# Patient Record
Sex: Female | Born: 1950 | Race: White | Hispanic: No | Marital: Married | Smoking: Never smoker
Health system: Southern US, Community
[De-identification: ages and names within clinical notes are randomized; demographics above are authoritative.]

## PROBLEM LIST (undated history)

## (undated) DIAGNOSIS — N6019 Diffuse cystic mastopathy of unspecified breast: Secondary | ICD-10-CM

## (undated) DIAGNOSIS — T7840XA Allergy, unspecified, initial encounter: Secondary | ICD-10-CM

## (undated) DIAGNOSIS — M8580 Other specified disorders of bone density and structure, unspecified site: Secondary | ICD-10-CM

## (undated) DIAGNOSIS — I10 Essential (primary) hypertension: Secondary | ICD-10-CM

## (undated) DIAGNOSIS — D219 Benign neoplasm of connective and other soft tissue, unspecified: Secondary | ICD-10-CM

## (undated) HISTORY — DX: Diffuse cystic mastopathy of unspecified breast: N60.19

## (undated) HISTORY — DX: Benign neoplasm of connective and other soft tissue, unspecified: D21.9

## (undated) HISTORY — PX: TUBAL LIGATION: SHX77

## (undated) HISTORY — DX: Essential (primary) hypertension: I10

## (undated) HISTORY — DX: Allergy, unspecified, initial encounter: T78.40XA

## (undated) HISTORY — PX: TONSILLECTOMY AND ADENOIDECTOMY: SHX28

## (undated) HISTORY — DX: Other specified disorders of bone density and structure, unspecified site: M85.80

## (undated) HISTORY — PX: BREAST EXCISIONAL BIOPSY: SUR124

## (undated) HISTORY — PX: BREAST SURGERY: SHX581

---

## 1977-07-26 HISTORY — PX: BREAST EXCISIONAL BIOPSY: SUR124

## 1979-07-27 HISTORY — PX: OTHER SURGICAL HISTORY: SHX169

## 1997-07-26 HISTORY — PX: ABDOMINAL HYSTERECTOMY: SHX81

## 1998-09-29 ENCOUNTER — Other Ambulatory Visit: Admission: RE | Admit: 1998-09-29 | Discharge: 1998-09-29 | Payer: Self-pay | Admitting: Obstetrics and Gynecology

## 1999-08-14 ENCOUNTER — Other Ambulatory Visit: Admission: RE | Admit: 1999-08-14 | Discharge: 1999-08-14 | Payer: Self-pay | Admitting: Obstetrics and Gynecology

## 1999-11-26 ENCOUNTER — Other Ambulatory Visit: Admission: RE | Admit: 1999-11-26 | Discharge: 1999-11-26 | Payer: Self-pay | Admitting: Obstetrics and Gynecology

## 1999-11-26 ENCOUNTER — Encounter (INDEPENDENT_AMBULATORY_CARE_PROVIDER_SITE_OTHER): Payer: Self-pay

## 2000-08-26 ENCOUNTER — Other Ambulatory Visit: Admission: RE | Admit: 2000-08-26 | Discharge: 2000-08-26 | Payer: Self-pay | Admitting: Obstetrics and Gynecology

## 2001-09-13 ENCOUNTER — Other Ambulatory Visit: Admission: RE | Admit: 2001-09-13 | Discharge: 2001-09-13 | Payer: Self-pay | Admitting: Obstetrics and Gynecology

## 2002-09-19 ENCOUNTER — Other Ambulatory Visit: Admission: RE | Admit: 2002-09-19 | Discharge: 2002-09-19 | Payer: Self-pay | Admitting: Obstetrics and Gynecology

## 2003-09-23 ENCOUNTER — Other Ambulatory Visit: Admission: RE | Admit: 2003-09-23 | Discharge: 2003-09-23 | Payer: Self-pay | Admitting: Obstetrics and Gynecology

## 2004-10-15 ENCOUNTER — Other Ambulatory Visit: Admission: RE | Admit: 2004-10-15 | Discharge: 2004-10-15 | Payer: Self-pay | Admitting: Obstetrics and Gynecology

## 2005-02-18 ENCOUNTER — Ambulatory Visit: Payer: Self-pay | Admitting: Internal Medicine

## 2005-11-03 ENCOUNTER — Other Ambulatory Visit: Admission: RE | Admit: 2005-11-03 | Discharge: 2005-11-03 | Payer: Self-pay | Admitting: Obstetrics and Gynecology

## 2006-05-06 ENCOUNTER — Ambulatory Visit: Payer: Self-pay | Admitting: Internal Medicine

## 2006-09-28 ENCOUNTER — Other Ambulatory Visit: Admission: RE | Admit: 2006-09-28 | Discharge: 2006-09-28 | Payer: Self-pay | Admitting: Radiology

## 2007-01-10 ENCOUNTER — Other Ambulatory Visit: Admission: RE | Admit: 2007-01-10 | Discharge: 2007-01-10 | Payer: Self-pay | Admitting: Obstetrics and Gynecology

## 2007-03-21 DIAGNOSIS — M949 Disorder of cartilage, unspecified: Secondary | ICD-10-CM

## 2007-03-21 DIAGNOSIS — M899 Disorder of bone, unspecified: Secondary | ICD-10-CM | POA: Insufficient documentation

## 2007-05-13 ENCOUNTER — Encounter: Payer: Self-pay | Admitting: Internal Medicine

## 2007-05-29 ENCOUNTER — Telehealth: Payer: Self-pay | Admitting: Internal Medicine

## 2007-10-13 ENCOUNTER — Encounter: Payer: Self-pay | Admitting: Internal Medicine

## 2008-01-11 ENCOUNTER — Other Ambulatory Visit: Admission: RE | Admit: 2008-01-11 | Discharge: 2008-01-11 | Payer: Self-pay | Admitting: Obstetrics and Gynecology

## 2008-10-08 ENCOUNTER — Telehealth: Payer: Self-pay | Admitting: Internal Medicine

## 2009-01-21 ENCOUNTER — Encounter: Payer: Self-pay | Admitting: Obstetrics and Gynecology

## 2009-01-21 ENCOUNTER — Ambulatory Visit: Payer: Self-pay | Admitting: Obstetrics and Gynecology

## 2009-01-21 ENCOUNTER — Other Ambulatory Visit: Admission: RE | Admit: 2009-01-21 | Discharge: 2009-01-21 | Payer: Self-pay | Admitting: Obstetrics and Gynecology

## 2009-11-12 ENCOUNTER — Ambulatory Visit: Payer: Self-pay | Admitting: Internal Medicine

## 2009-11-12 LAB — CONVERTED CEMR LAB
ALT: 20 units/L (ref 0–35)
BUN: 19 mg/dL (ref 6–23)
Basophils Relative: 0.7 % (ref 0.0–3.0)
Bilirubin Urine: NEGATIVE
CO2: 28 meq/L (ref 19–32)
Chloride: 107 meq/L (ref 96–112)
Cholesterol: 209 mg/dL — ABNORMAL HIGH (ref 0–200)
Creatinine, Ser: 1.2 mg/dL (ref 0.4–1.2)
Direct LDL: 135.9 mg/dL
Eosinophils Absolute: 0.2 10*3/uL (ref 0.0–0.7)
Eosinophils Relative: 3.1 % (ref 0.0–5.0)
Glucose, Urine, Semiquant: NEGATIVE
HCT: 36.1 % (ref 36.0–46.0)
Lymphs Abs: 2.5 10*3/uL (ref 0.7–4.0)
MCHC: 34.3 g/dL (ref 30.0–36.0)
MCV: 85.2 fL (ref 78.0–100.0)
Monocytes Absolute: 0.5 10*3/uL (ref 0.1–1.0)
Neutrophils Relative %: 51.7 % (ref 43.0–77.0)
Platelets: 268 10*3/uL (ref 150.0–400.0)
Potassium: 4.4 meq/L (ref 3.5–5.1)
Specific Gravity, Urine: 1.02
TSH: 1.8 microintl units/mL (ref 0.35–5.50)
Total Protein: 7.3 g/dL (ref 6.0–8.3)
VLDL: 31.2 mg/dL (ref 0.0–40.0)
Vit D, 25-Hydroxy: 39 ng/mL (ref 30–89)
WBC Urine, dipstick: NEGATIVE
WBC: 6.9 10*3/uL (ref 4.5–10.5)
pH: 7

## 2009-11-26 ENCOUNTER — Ambulatory Visit: Payer: Self-pay | Admitting: Internal Medicine

## 2009-11-26 DIAGNOSIS — E785 Hyperlipidemia, unspecified: Secondary | ICD-10-CM | POA: Insufficient documentation

## 2009-11-26 DIAGNOSIS — K219 Gastro-esophageal reflux disease without esophagitis: Secondary | ICD-10-CM | POA: Insufficient documentation

## 2009-11-28 ENCOUNTER — Encounter (INDEPENDENT_AMBULATORY_CARE_PROVIDER_SITE_OTHER): Payer: Self-pay | Admitting: *Deleted

## 2010-01-08 ENCOUNTER — Encounter (INDEPENDENT_AMBULATORY_CARE_PROVIDER_SITE_OTHER): Payer: Self-pay | Admitting: *Deleted

## 2010-01-09 ENCOUNTER — Ambulatory Visit: Payer: Self-pay | Admitting: Gastroenterology

## 2010-01-21 ENCOUNTER — Ambulatory Visit: Payer: Self-pay | Admitting: Gastroenterology

## 2010-02-13 ENCOUNTER — Ambulatory Visit: Payer: Self-pay | Admitting: Internal Medicine

## 2010-02-13 LAB — CONVERTED CEMR LAB
ALT: 22 units/L (ref 0–35)
AST: 24 units/L (ref 0–37)
Alkaline Phosphatase: 69 units/L (ref 39–117)
Bilirubin, Direct: 0.1 mg/dL (ref 0.0–0.3)
Total Bilirubin: 0.6 mg/dL (ref 0.3–1.2)
Total CHOL/HDL Ratio: 5

## 2010-02-23 ENCOUNTER — Telehealth: Payer: Self-pay | Admitting: Internal Medicine

## 2010-08-25 NOTE — Letter (Signed)
Summary: [facility] Instructions  [facility]  [address] [place], [place] [address]   Phone: [phone]  Fax: [phone]       Carolyn Wise    March 02, 1951    MRN: [id number]        Procedure Day Dorna Bloom:  [facility]  01/21/10     Arrival Time:  8:00AM      Procedure Time:  9:00AM     Location of Procedure:                    _ X_  [facility] (4th Floor)                        PREPARATION FOR COLONOSCOPY WITH MOVIPREP   Starting 5 days prior to your procedure 01/16/10 do not eat nuts, seeds, popcorn, corn, beans, peas,  salads, or any raw vegetables.  Do not take any fiber supplements (e.g. Metamucil, Citrucel, and Benefiber).  THE DAY BEFORE YOUR PROCEDURE         DATE: 01/20/10  DAY: TUESDAY  1.  Drink clear liquids the entire day-NO SOLID FOOD  2.  Do not drink anything colored red or purple.  Avoid juices with pulp.  No orange juice.  3.  Drink at least 64 oz. (8 glasses) of fluid/clear liquids during the day to prevent dehydration and help the prep work efficiently.  CLEAR LIQUIDS INCLUDE: Water Jello Ice Popsicles Tea (sugar ok, no milk/cream) Powdered fruit flavored drinks Coffee (sugar ok, no milk/cream) Gatorade Juice: apple, white grape, white cranberry  Lemonade Clear bullion, consomm, broth Carbonated beverages (any kind) Strained chicken noodle soup Hard Candy                             4.  In the morning, mix first dose of MoviPrep solution:    Empty 1 Pouch A and 1 Pouch B into the disposable container    Add lukewarm drinking water to the top line of the container. Mix to dissolve    Refrigerate (mixed solution should be used within 24 hrs)  5.  Begin drinking the prep at 5:00 p.m. The MoviPrep container is divided by 4 marks.   Every 15 minutes drink the solution down to the next mark (approximately 8 oz) until the full liter is complete.   6.  Follow completed prep with 16 oz of clear liquid of your choice  (Nothing red or purple).  Continue to drink clear liquids until bedtime.  7.  Before going to bed, mix second dose of MoviPrep solution:    Empty 1 Pouch A and 1 Pouch B into the disposable container    Add lukewarm drinking water to the top line of the container. Mix to dissolve    Refrigerate  THE DAY OF YOUR PROCEDURE      DATE: 01/21/10  DAY: WEDNESDAY  Beginning at 4:00AM (5 hours before procedure):         1. Every 15 minutes, drink the solution down to the next mark (approx 8 oz) until the full liter is complete.  2. Follow completed prep with 16 oz. of clear liquid of your choice.    3. You may drink clear liquids until 7:00AM (2 HOURS BEFORE PROCEDURE).   MEDICATION INSTRUCTIONS  Unless otherwise instructed, you should take regular prescription medications with a small sip of water   as early as possible the  morning of your procedure.         OTHER INSTRUCTIONS  You will need a responsible adult at least 59 years of age to accompany you and drive you home.   This person must remain in the waiting room during your procedure.  Wear loose fitting clothing that is easily removed.  Leave jewelry and other valuables at home.  However, you may wish to bring a book to read or  an iPod/MP3 player to listen to music as you wait for your procedure to start.  Remove all body piercing jewelry and leave at home.  Total time from sign-in until discharge is approximately 2-3 hours.  You should go home directly after your procedure and rest.  You can resume normal activities the  day after your procedure.  The day of your procedure you should not:   Drive   Make legal decisions   Operate machinery   Drink alcohol   Return to work  You will receive specific instructions about eating, activities and medications before you leave.    The above instructions have been reviewed and explained to me by   Wyona Almas RN  January 09, 2010 4:23 PM     I fully  understand and can verbalize these instructions _____________________________ Date _________

## 2010-08-25 NOTE — Progress Notes (Signed)
Summary: Pt req to have lab results mailed to her home address  Phone Note Call from Patient Call back at Home Phone [phone]   Caller: Patient Summary of Call: Pt wants to req to have her lab results mailed to her home address. Pt was sch for 3 month follow up on 03/02/10, but she doesnt want to have to pay another copay,just to get lab results.  Pt has cx her appt.  Initial call taken by: Lucy Antigua,  February 23, 2010 2:31 PM  Follow-up for Phone Call        print out so I can write on it and we will send them to her Follow-up by: Stacie Glaze MD,  February 25, 2010 9:50 AM  Additional Follow-up for Phone Call Additional follow up Details #1::        print out given to dr Lovell Sheehan and will mail Additional Follow-up by: Willy Eddy, LPN,  February 25, 2010 1:44 PM

## 2010-08-25 NOTE — Miscellaneous (Signed)
Summary: rx for analpram  Clinical Lists Changes  Medications: Added new medication of ANALPRAM-HC 1-1 %  CREA (HYDROCORTISONE ACE-PRAMOXINE) apply twice daily as needed - Signed Rx of ANALPRAM-HC 1-1 %  CREA (HYDROCORTISONE ACE-PRAMOXINE) apply twice daily as needed;  #1 tube x 2;  Signed;  Entered by: Oda Cogan RN;  Authorized by: Meryl Dare MD [facility];  Method used: [id number] [address] [place], [place]  [address], Ph: [phone] or [phone], Fax: [phone]    Prescriptions: ANALPRAM-HC 1-1 %  CREA (HYDROCORTISONE ACE-PRAMOXINE) apply twice daily as needed  #1 tube x 2   Entered by:   Oda Cogan RN   Authorized by:   Meryl Dare MD [facility]   Signed by:   Oda Cogan RN on 01/21/2010   Method used:   [facility]ronically to        [facility] [phone]* (retail)       [address]       [place], [place]  [address]       Ph: [phone] or [phone]       Fax: [phone]   RxID:   [id number]

## 2010-08-25 NOTE — Miscellaneous (Signed)
Summary: [facility] Previsit/prep  Clinical Lists Changes  Medications: Added new medication of MOVIPREP 100 GM  SOLR (PEG-KCL-NACL-NASULF-NA ASC-C) As per prep instructions. - Signed Rx of MOVIPREP 100 GM  SOLR (PEG-KCL-NACL-NASULF-NA ASC-C) As per prep instructions.;  #1 x 0;  Signed;  Entered by: Wyona Almas RN;  Authorized by: Meryl Dare MD [facility];  Method used: [id number] [address] [place], [place]  [address], Ph: [phone] or [phone], Fax: [phone] Observations: Added new observation of ALLERGY REV: Done (01/09/2010 15:51)    Prescriptions: MOVIPREP 100 GM  SOLR (PEG-KCL-NACL-NASULF-NA ASC-C) As per prep instructions.  #1 x 0   Entered by:   Wyona Almas RN   Authorized by:   Meryl Dare MD [facility]   Signed by:   Wyona Almas RN on 01/09/2010   Method used:   Electronically to        [facility] [phone]* (retail)       [address]       [place], [place]  [address]       Ph: [phone] or [phone]       Fax: [phone]   RxID:   [id number]

## 2010-08-25 NOTE — Procedures (Signed)
Summary: Colonoscopy  Patient: [name] Note: All result statuses are Final unless otherwise noted.  Tests: (1) Colonoscopy (COL)   COL Colonoscopy           DONE     [facility]     [address]     [place], [place]  [address]           COLONOSCOPY PROCEDURE REPORT           PATIENT:  Carolyn Wise, Carolyn Wise  MR#:  [id number]     BIRTHDATE:  1951/03/04, 58 yrs. old  GENDER:  female     ENDOSCOPIST:  Judie Petit T. Russella Dar, MD, [facility]     Referred by:  Stacie Glaze, M.D.     PROCEDURE DATE:  01/21/2010     PROCEDURE:  Colonoscopy [address]     ASA CLASS:  Class II     INDICATIONS:  1) Routine Risk Screening     MEDICATIONS:   Fentanyl 75 mcg IV, Versed 6 mg IV     DESCRIPTION OF PROCEDURE:   After the risks benefits and     alternatives of the procedure were thoroughly explained, informed     consent was obtained.  Digital rectal exam was performed and     revealed small external hemorrhoids.  The LB [id number]-[id number]     endoscope was introduced through the anus and advanced to the     cecum, which was identified by both the appendix and ileocecal     valve, limited by a tortuous colon.  The quality of the prep was     excellent, using MoviPrep.  The instrument was then slowly     withdrawn as the colon was fully examined.     <<PROCEDUREIMAGES>>           FINDINGS:  A normal appearing cecum, ileocecal valve, and     appendiceal orifice were identified. The ascending, hepatic     flexure, transverse, splenic flexure, descending, sigmoid colon,     and rectum appeared unremarkable.   Retroflexed views in the     rectum revealed no abnormalities.  The time to cecum =  5.5     minutes. The scope was then withdrawn (time =  9.67  min) from the     patient and the procedure completed.           COMPLICATIONS:  None           ENDOSCOPIC IMPRESSION:     1) Normal colon           RECOMMENDATIONS:     1) Continue current colorectal screening recommendations for     "routine risk"  patients with a repeat colonoscopy in 10 years.           Venita Lick. Russella Dar, MD, [facility]           n.     eSIGNED:   Venita Lick. Yiselle Babich at 01/21/2010 09:25 AM           Carlynn Spry, [phone]  Note: An exclamation mark (!) indicates a result that was not dispersed into the flowsheet. Document Creation Date: 01/21/2010 9:25 AM _______________________________________________________________________  (1) Order result status: Final Collection or observation date-time: 01/21/2010 09:21 Requested date-time:  Receipt date-time:  Reported date-time:  Referring Physician:   Ordering Physician: Claudette Head [phone]) Specimen Source:  Source: Launa Grill Order Number: [id number] Lab site:   Appended Document: Colonoscopy    Clinical Lists Changes  Observations: Added  new observation of COLONNXTDUE: 12/2019 (01/21/2010 12:34)

## 2010-08-25 NOTE — Letter (Signed)
Summary: Previsit letter  [facility]  [address] [place], [place] [address]   Phone: [phone]  Fax: [phone]       11/28/2009 MRN: [id number]  Carolyn Wise [address] [place], [place]  [address]  Dear Ms. Bougher,  Welcome to the Gastroenterology Division at [facility].    You are scheduled to see a nurse for your pre-procedure visit on 01-09-10 at 4pm on the 3rd floor at [facility], [address]  We ask that you try to arrive at our office 15 minutes prior to your appointment time to allow for check-in.  Your nurse visit will consist of discussing your medical and surgical history, your immediate family medical history, and your medications.    Please bring a complete list of all your medications or, if you prefer, bring the medication bottles and we will list them.  We will need to be aware of both prescribed and over the counter drugs.  We will need to know exact dosage information as well.  If you are on blood thinners (Coumadin, Plavix, Aggrenox, Ticlid, etc.) please call our office today/prior to your appointment, as we need to consult with your physician about holding your medication.   Please be prepared to read and sign documents such as consent forms, a financial agreement, and acknowledgement forms.  If necessary, and with your consent, a friend or relative is welcome to sit-in on the nurse visit with you.  Please bring your insurance card so that we may make a copy of it.  If your insurance requires a referral to see a specialist, please bring your referral form from your primary care physician.  No co-pay is required for this nurse visit.     If you cannot keep your appointment, please call [phone] to cancel or reschedule prior to your appointment date.  This allows [place] the opportunity to schedule an appointment for another patient in need of care.    Thank you for choosing [facility] for your medical needs.  We  appreciate the opportunity to care for you.  Please visit [place] at our website  to learn more about our practice.                     Sincerely.                                                                                                                   The Gastroenterology Division

## 2010-08-25 NOTE — Assessment & Plan Note (Signed)
Summary: cpx---no pap//cm   Vital Signs:  Patient profile:   60 year old female Height:      64 inches Weight:      138 pounds BMI:     23.77 Temp:     98.2 degrees F oral Pulse rate:   68 / minute Resp:     14 per minute BP sitting:   130 / 80  (left arm)  Vitals Entered By: Willy Eddy, LPN (Nov 27, 8142 2:25 PM) CC: cpx-no colopnsocopy   CC:  cpx-no colopnsocopy.  History of Present Illness: The pt was asked about all immunizations, health maint. 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 and some mild lower GI BRBPR the pt is due a colon  The pt has bprderline lipis and is intolerate of fish oil  Her gerd is pronouncedand has been using OTC frequently  Preventive Screening-Counseling & Management  Alcohol-Tobacco     Smoking Status: never  Problems Prior to Update: 1)  Family History of Cad Female 1st Degree Relative <50  (ICD-V17.3) 2)  Family History of Cad Female 1st Degree Relative <60  (ICD-V16.49) 3)  Family History Breast Cancer 1st Degree Relative <50  (ICD-V16.3) 4)  Osteopenia  (ICD-733.90)  Current Problems (verified): 1)  Family History of Cad Female 1st Degree Relative <50  (ICD-V17.3) 2)  Family History of Cad Female 1st Degree Relative <60  (ICD-V16.49) 3)  Family History Breast Cancer 1st Degree Relative <50  (ICD-V16.3) 4)  Osteopenia  (ICD-733.90)  Medications Prior to Update: 1)  Adult Aspirin Low Strength 81 Mg  Tbdp (Aspirin) 2)  Zyrtec Allergy 10 Mg  Tabs (Cetirizine Hcl) 3)  Neomycin-Polymyxin-Dexameth 0.35-10000-0.1 Susp (Neomycin-Polymyxin-Dexameth) .... Opthalmic Suspension--2 Drops in Affected Eye Qid  Current Medications (verified): 1)  Adult Aspirin Low Strength 81 Mg  Tbdp (Aspirin) 2)  B Complex  Tabs (B Complex Vitamins) .Marland Kitchen.. 1 Once Daily 3)  Super Calcium/d 600-125 Mg-Unit Tabs (Calcium-Vitamin D) .Marland Kitchen.. 1  Once Daily 4)  Omega-3 Cf 1000 Mg Caps (Omega-3 Fatty Acids) .Marland Kitchen.. 1 Once Daily 5)  Krill Oil 1000 Mg Caps (Krill Oil) .... One By Mouth Two Times A Day  Allergies (verified): 1)  ! Sulfa  Past History:  Family History: Last updated: 03/21/2007 Family History of Alzheimers Family History Breast cancer 1st degree relative <50 Family History of CAD Female 1st degree relative <60 Family History of CAD Female 1st degree relative <50 Family History Hypertension Family History of Respiratory disease  Social History: Last updated: 03/21/2007 Occupation: Teacher, English as a foreign language Married Never Smoked  Risk Factors: Smoking Status: never (11/26/2009)  Past medical, surgical, family and social histories (including risk factors) reviewed, and no changes noted (except as noted below).  Past Medical History: Reviewed history from 03/21/2007 and no changes required. Allergies Osteopenia  Past Surgical History: Reviewed history from 03/21/2007 and no changes required. TAH  Family History: Reviewed history from 03/21/2007 and no changes required. Family History of Alzheimers Family History Breast cancer 1st degree relative <50 Family History of CAD Female 1st degree relative <60 Family History of CAD Female 1st degree relative <50 Family History Hypertension Family History of Respiratory disease  Social History: Reviewed history from 03/21/2007 and no changes required. Occupation: Teacher, English as a foreign language Married Never Smoked  Review of Systems  The patient denies anorexia, fever, weight loss, weight gain, vision loss, decreased hearing, hoarseness, chest pain,  syncope, dyspnea on exertion, peripheral edema, prolonged cough, headaches, hemoptysis, abdominal pain, melena, hematochezia, severe indigestion/heartburn, hematuria, incontinence, genital sores, muscle weakness, suspicious skin lesions, transient blindness, difficulty walking, depression, unusual weight change, abnormal bleeding,  enlarged lymph nodes, angioedema, and breast masses.    Physical Exam  General:  alert and well-hydrated.   Head:  normocephalic and atraumatic.   Eyes:  pupils equal and pupils round.   Ears:  R ear normal and L ear normal.   Nose:  no external deformity and no nasal discharge.   Neck:  supple and full ROM.   Lungs:  normal respiratory effort, no intercostal retractions, and no wheezes.   Heart:  normal rate and regular rhythm.   Abdomen:  soft and non-tender.   Msk:  normal ROM, no joint tenderness, and no joint deformities.   Extremities:  No clubbing, cyanosis, edema, or deformity noted with normal full range of motion of all joints.   Neurologic:  No cranial nerve deficits noted. Station and gait are normal. Plantar reflexes are down-going bilaterally. DTRs are symmetrical throughout. Sensory, motor and coordinative functions appear intact. Skin:  Intact without suspicious lesions or rashes Psych:  Cognition and judgment appear intact. Alert and cooperative with normal attention span and concentration. No apparent delusions, illusions, hallucinations   Impression & Recommendations:  Problem # 1:  HEALTH SCREENING (ICD-V70.0) refer to  GI consider EDG afer colon is PPI does not heal GERD Orders: EKG w/ Interpretation (93000) Gastroenterology Referral (GI)  Mammogram: normal (01/22/2009) Pap smear: normal (01/22/2009) Td Booster: Historical (07/26/2002)   Chol: 209 (11/12/2009)   HDL: 52.20 (11/12/2009)   TG: 156.0 (11/12/2009) TSH: 1.80 (11/12/2009)   Next mammogram due:: 01/2010 (11/26/2009)  Discussed using sunscreen, use of alcohol, drug use, self breast exam, routine dental care, routine eye care, schedule for GYN exam, routine physical exam, seat belts, multiple vitamins, osteoporosis prevention, adequate calcium intake in diet, recommendations for immunizations, mammograms and Pap smears.  Discussed exercise and checking cholesterol.  Discussed gun safety, safe sex, and  contraception.  Problem # 2:  GERD (ICD-530.81)  trial of GI PPI  nexium 40   Labs Reviewed: Hgb: 12.4 (11/12/2009)   Hct: 36.1 (11/12/2009)  Her updated medication list for this problem includes:    Nexium 40 Mg Cpdr (Esomeprazole magnesium) ..... One by mouth daily  Problem # 3:  HYPERLIPIDEMIA, BORDERLINE (ICD-272.4) kril oil as a substitute Labs Reviewed: SGOT: 21 (11/12/2009)   SGPT: 20 (11/12/2009)   HDL:52.20 (11/12/2009)  Chol:209 (11/12/2009)  Trig:156.0 (11/12/2009)  Complete Medication List: 1)  Adult Aspirin Low Strength 81 Mg Tbdp (Aspirin) 2)  B Complex Tabs (B complex vitamins) .Marland Kitchen.. 1 once daily 3)  Super Calcium/d 600-125 Mg-unit Tabs (Calcium-vitamin d) .Marland Kitchen.. 1 once daily 4)  Krill Oil 1000 Mg Caps (Krill oil) .... One by mouth two times a day 5)  Vitamin D3 1000 Unit Caps (Cholecalciferol) .... One by mouth daily 6)  Nexium 40 Mg Cpdr (Esomeprazole magnesium) .... One by mouth daily  Patient Instructions: 1)  Please schedule a follow-up appointment in 3 months. 2)  Hepatic Panel prior to visit, ICD-9:995.20 3)  Lipid Panel prior to visit, ICD-9:272.4   Preventive Care Screening  Mammogram:    Date:  01/22/2009    Next Due:  01/2010    Results:  normal   Pap Smear:    Date:  01/22/2009    Next Due:  01/2010    Results:  normal    Contraindications/Deferment of Procedures/Staging:  Test/Procedure: FLU VAX    Reason for deferment: patient declined

## 2011-01-25 ENCOUNTER — Other Ambulatory Visit: Payer: Self-pay | Admitting: Obstetrics and Gynecology

## 2011-01-25 DIAGNOSIS — Z803 Family history of malignant neoplasm of breast: Secondary | ICD-10-CM

## 2011-01-25 DIAGNOSIS — N644 Mastodynia: Secondary | ICD-10-CM

## 2011-01-25 DIAGNOSIS — Z1231 Encounter for screening mammogram for malignant neoplasm of breast: Secondary | ICD-10-CM

## 2011-01-28 ENCOUNTER — Other Ambulatory Visit: Payer: Self-pay | Admitting: Obstetrics and Gynecology

## 2011-01-28 DIAGNOSIS — M8580 Other specified disorders of bone density and structure, unspecified site: Secondary | ICD-10-CM

## 2011-02-01 ENCOUNTER — Ambulatory Visit
Admission: RE | Admit: 2011-02-01 | Discharge: 2011-02-01 | Disposition: A | Discharge status: Home / Self Care | Payer: BC Managed Care – PPO | Source: Ambulatory Visit | Attending: Obstetrics and Gynecology | Admitting: Obstetrics and Gynecology

## 2011-02-01 ENCOUNTER — Other Ambulatory Visit: Payer: Self-pay | Admitting: Obstetrics and Gynecology

## 2011-02-01 DIAGNOSIS — Z803 Family history of malignant neoplasm of breast: Secondary | ICD-10-CM

## 2011-02-01 DIAGNOSIS — N644 Mastodynia: Secondary | ICD-10-CM

## 2011-02-03 ENCOUNTER — Encounter (INDEPENDENT_AMBULATORY_CARE_PROVIDER_SITE_OTHER): Payer: BC Managed Care – PPO | Admitting: Obstetrics and Gynecology

## 2011-02-03 ENCOUNTER — Other Ambulatory Visit (HOSPITAL_COMMUNITY)
Admission: RE | Admit: 2011-02-03 | Discharge: 2011-02-03 | Disposition: A | Discharge status: Home / Self Care | Payer: BC Managed Care – PPO | Source: Ambulatory Visit | Attending: Obstetrics and Gynecology | Admitting: Obstetrics and Gynecology

## 2011-02-03 ENCOUNTER — Other Ambulatory Visit: Payer: Self-pay | Admitting: Obstetrics and Gynecology

## 2011-02-03 DIAGNOSIS — Z01419 Encounter for gynecological examination (general) (routine) without abnormal findings: Secondary | ICD-10-CM

## 2011-02-03 DIAGNOSIS — Z124 Encounter for screening for malignant neoplasm of cervix: Secondary | ICD-10-CM | POA: Insufficient documentation

## 2011-02-03 DIAGNOSIS — R82998 Other abnormal findings in urine: Secondary | ICD-10-CM

## 2011-02-03 NOTE — Progress Notes (Unsigned)
Subjective:     Patient ID: Carolyn Wise, female   DOB: 1951-06-12, 60 y.o.   MRN: [id number]  HPI   Review of Systems     Objective:   Physical Exam     Assessment:     ***    Plan:     ***

## 2011-02-09 ENCOUNTER — Ambulatory Visit
Admission: RE | Admit: 2011-02-09 | Discharge: 2011-02-09 | Disposition: A | Discharge status: Home / Self Care | Payer: BC Managed Care – PPO | Source: Ambulatory Visit | Attending: Obstetrics and Gynecology | Admitting: Obstetrics and Gynecology

## 2011-02-09 DIAGNOSIS — M8580 Other specified disorders of bone density and structure, unspecified site: Secondary | ICD-10-CM

## 2011-03-10 ENCOUNTER — Other Ambulatory Visit (INDEPENDENT_AMBULATORY_CARE_PROVIDER_SITE_OTHER): Payer: BC Managed Care – PPO

## 2011-03-10 DIAGNOSIS — Z0000 Encounter for general adult medical examination without abnormal findings: Secondary | ICD-10-CM

## 2011-03-10 LAB — BASIC METABOLIC PANEL
BUN: 24 mg/dL — ABNORMAL HIGH (ref 6–23)
Creatinine, Ser: 1.4 mg/dL — ABNORMAL HIGH (ref 0.4–1.2)
GFR: 40.26 mL/min — ABNORMAL LOW (ref >60.00)
Glucose, Bld: 117 mg/dL — ABNORMAL HIGH (ref 70–99)
Potassium: 4.3 mEq/L (ref 3.5–5.1)

## 2011-03-10 LAB — LIPID PANEL
Cholesterol: 223 mg/dL — ABNORMAL HIGH (ref 0–200)
Triglycerides: 195 mg/dL — ABNORMAL HIGH (ref 0.0–149.0)
VLDL: 39 mg/dL (ref 0.0–40.0)

## 2011-03-10 LAB — HEPATIC FUNCTION PANEL
AST: 24 U/L (ref 0–37)
Albumin: 4.2 g/dL (ref 3.5–5.2)
Alkaline Phosphatase: 71 U/L (ref 39–117)
Total Protein: 7.2 g/dL (ref 6.0–8.3)

## 2011-03-10 LAB — POCT URINALYSIS DIPSTICK
Blood, UA: NEGATIVE
Spec Grav, UA: 1.02
Urobilinogen, UA: 0.2

## 2011-03-10 LAB — CBC WITH DIFFERENTIAL/PLATELET
Basophils Absolute: 0 10*3/uL (ref 0.0–0.1)
HCT: 39 % (ref 36.0–46.0)
Lymphs Abs: 2.2 10*3/uL (ref 0.7–4.0)
Monocytes Absolute: 0.4 10*3/uL (ref 0.1–1.0)
Monocytes Relative: 5.2 % (ref 3.0–12.0)
Neutrophils Relative %: 60 % (ref 43.0–77.0)
Platelets: 263 10*3/uL (ref 150.0–400.0)
RDW: 14.7 % — ABNORMAL HIGH (ref 11.5–14.6)
WBC: 6.8 10*3/uL (ref 4.5–10.5)

## 2011-03-10 LAB — TSH: TSH: 2.24 u[IU]/mL (ref 0.35–5.50)

## 2011-03-24 ENCOUNTER — Ambulatory Visit (INDEPENDENT_AMBULATORY_CARE_PROVIDER_SITE_OTHER): Payer: BC Managed Care – PPO | Admitting: Internal Medicine

## 2011-03-24 ENCOUNTER — Encounter: Payer: Self-pay | Admitting: Internal Medicine

## 2011-03-24 VITALS — BP 144/88 | HR 72 | Temp 98.2°F | Resp 16 | Ht 61.0 in | Wt 140.0 lb

## 2011-03-24 DIAGNOSIS — Z0000 Encounter for general adult medical examination without abnormal findings: Secondary | ICD-10-CM

## 2011-03-24 DIAGNOSIS — I10 Essential (primary) hypertension: Secondary | ICD-10-CM

## 2011-03-24 DIAGNOSIS — R7989 Other specified abnormal findings of blood chemistry: Secondary | ICD-10-CM

## 2011-03-24 DIAGNOSIS — R799 Abnormal finding of blood chemistry, unspecified: Secondary | ICD-10-CM

## 2011-03-24 MED ORDER — BISOPROLOL-HYDROCHLOROTHIAZIDE 2.5-6.25 MG PO TABS: 1.0000 | ORAL_TABLET | Freq: Every day | ORAL | Status: DC

## 2011-03-24 NOTE — Progress Notes (Signed)
Subjective:    Patient ID: Carolyn Wise, female    DOB: 09-Jun-1951, 60 y.o.   MRN: [id number]  HPI  Patient presents for complete physical examination.  His chief complaint today of hearing a pounding in her right ear.  She does have moderate hyperlipidemia which has worsened she does have elevated blood pressure today and there is some risk factor for coronary artery disease in her family.  In review of her blood work shows an elevated creatinine and BUN  Review of Systems  Constitutional: Negative for activity change, appetite change and fatigue.  HENT: Negative for ear pain, congestion, neck pain, postnasal drip and sinus pressure.   Eyes: Negative for redness and visual disturbance.  Respiratory: Negative for cough, shortness of breath and wheezing.   Gastrointestinal: Negative for abdominal pain and abdominal distention.  Genitourinary: Negative for dysuria, frequency and menstrual problem.  Musculoskeletal: Negative for myalgias, joint swelling and arthralgias.  Skin: Negative for rash and wound.  Neurological: Negative for dizziness, weakness and headaches.  Hematological: Negative for adenopathy. Does not bruise/bleed easily.  Psychiatric/Behavioral: Negative for sleep disturbance and decreased concentration.   Past Medical History  Diagnosis Date  . Allergy   . Osteopenia    Past Surgical History  Procedure Date  . Tonsillectomy and adenoidectomy     reports that she has never smoked. She does not have any smokeless tobacco history on file. Her alcohol and drug histories not on file. family history includes Alzheimer's disease in an unspecified family member; COPD in an unspecified family member; Cancer in an unspecified family member; Coronary artery disease in an unspecified family member; and Hypertension in an unspecified family member. Allergies  Allergen Reactions  . Sulfonamide Derivatives        Objective:   Physical Exam  Constitutional: She is oriented  to person, place, and time. She appears well-developed and well-nourished. No distress.  HENT:  Head: Normocephalic and atraumatic.  Right Ear: External ear normal.  Left Ear: External ear normal.  Nose: Nose normal.  Mouth/Throat: Oropharynx is clear and moist.  Eyes: Conjunctivae and EOM are normal. Pupils are equal, round, and reactive to light.  Neck: Normal range of motion. Neck supple. No JVD present. No tracheal deviation present. No thyromegaly present.  Cardiovascular: Normal rate, regular rhythm, normal heart sounds and intact distal pulses.   No murmur heard. Pulmonary/Chest: Effort normal and breath sounds normal. She has no wheezes. She exhibits no tenderness.  Abdominal: Soft. Bowel sounds are normal.  Musculoskeletal: Normal range of motion. She exhibits no edema and no tenderness.  Lymphadenopathy:    She has no cervical adenopathy.  Neurological: She is alert and oriented to person, place, and time. She has normal reflexes. No cranial nerve deficit.  Skin: Skin is warm and dry. She is not diaphoretic.  Psychiatric: She has a normal mood and affect. Her behavior is normal.          Assessment & Plan:   This is a routine physical examination for this healthy  Female. Reviewed all health maintenance protocols including mammography colonoscopy bone density and reviewed appropriate screening labs. Her immunization history was reviewed as well as her current medications and allergies refills of her chronic medications were given and the plan for yearly health maintenance was discussed all orders and referrals were made as appropriate. New onset hypertension with a history of borderline elevated blood pressure after exercise today her blood pressure went to 190/100 with a baseline blood pressure of 144/88.  We have discussed that given her renal insufficiency beginning a beta blocker diuretic and we discussed hydration drinking at least 5 glasses of water a day.  We are going  to repeat the basic metabolic panel today to make sure that the creatinine elevation was not due to dehydration, and physical but is a real elevation of her kidney function.  If indeed the creatinine remains elevated then an ultrasound of the renals will be pursued as well as carotid Dopplers due to the pulsation she feels in her neck after we have stabilized her blood pressure we'll begin to treat her lipids she is initially going to follow a low carbohydrate low sugar diet and followup in 2 months

## 2011-03-25 LAB — BASIC METABOLIC PANEL
BUN: 28 mg/dL — ABNORMAL HIGH (ref 6–23)
CO2: 25 mEq/L (ref 19–32)
Chloride: 107 mEq/L (ref 96–112)
Glucose, Bld: 96 mg/dL (ref 70–99)
Potassium: 4.1 mEq/L (ref 3.5–5.1)

## 2011-04-08 ENCOUNTER — Other Ambulatory Visit: Payer: Self-pay | Admitting: Dermatology

## 2011-04-08 ENCOUNTER — Telehealth: Payer: Self-pay | Admitting: Internal Medicine

## 2011-04-08 NOTE — Telephone Encounter (Signed)
Yes- you can schedule a fasting lipid 1 week prior to appointment-code 272.4-thanks--

## 2011-04-08 NOTE — Telephone Encounter (Signed)
Pt would like to get her chole re-check to see if diet plan is working. Pt is sch for ov 05-21-2011. Can I sch?

## 2011-04-09 NOTE — Telephone Encounter (Signed)
Pt has made an appt for 05-14-2011 for labs.

## 2011-04-09 NOTE — Telephone Encounter (Signed)
lmom for pt to callback to sch bloodwork

## 2011-05-14 ENCOUNTER — Other Ambulatory Visit (INDEPENDENT_AMBULATORY_CARE_PROVIDER_SITE_OTHER): Payer: BC Managed Care – PPO

## 2011-05-14 DIAGNOSIS — R82998 Other abnormal findings in urine: Secondary | ICD-10-CM

## 2011-05-14 DIAGNOSIS — E785 Hyperlipidemia, unspecified: Secondary | ICD-10-CM

## 2011-05-14 LAB — POCT URINALYSIS DIPSTICK
Nitrite, UA: NEGATIVE
Protein, UA: NEGATIVE
Spec Grav, UA: 1.015
Urobilinogen, UA: 0.2

## 2011-05-14 LAB — LIPID PANEL
LDL Cholesterol: 114 mg/dL — ABNORMAL HIGH (ref 0–99)
Total CHOL/HDL Ratio: 3
VLDL: 10.2 mg/dL (ref 0.0–40.0)

## 2011-05-14 NOTE — Progress Notes (Signed)
Addended by: Bonnye Fava on: 05/14/2011 03:55 PM   Modules accepted: Orders

## 2011-05-21 ENCOUNTER — Encounter: Payer: Self-pay | Admitting: Internal Medicine

## 2011-05-21 ENCOUNTER — Ambulatory Visit (INDEPENDENT_AMBULATORY_CARE_PROVIDER_SITE_OTHER): Payer: BC Managed Care – PPO | Admitting: Internal Medicine

## 2011-05-21 VITALS — BP 130/78 | HR 56 | Temp 98.0°F | Resp 14 | Ht 61.0 in | Wt 126.0 lb

## 2011-05-21 DIAGNOSIS — I10 Essential (primary) hypertension: Secondary | ICD-10-CM

## 2011-05-21 DIAGNOSIS — Z0000 Encounter for general adult medical examination without abnormal findings: Secondary | ICD-10-CM

## 2011-05-21 LAB — POCT URINALYSIS DIPSTICK
Ketones, UA: NEGATIVE
Protein, UA: NEGATIVE
pH, UA: 6

## 2011-05-21 NOTE — Progress Notes (Signed)
  Subjective:    Patient ID: Carolyn Wise, female    DOB: [birth year], 60 y.o.   MRN: [id number]  HPI patient presents for followup of blood pressure control with a history of hypertension and for weight loss and lipid control on a high protein diet with weight loss and exercise.  history of GERD which is stable and history of osteopenia which has been stable.      Review of Systems  Constitutional: Negative for activity change, appetite change and fatigue.  HENT: Negative for ear pain, congestion, neck pain, postnasal drip and sinus pressure.   Eyes: Negative for redness and visual disturbance.  Respiratory: Negative for cough, shortness of breath and wheezing.   Gastrointestinal: Negative for abdominal pain and abdominal distention.  Genitourinary: Negative for dysuria, frequency and menstrual problem.  Musculoskeletal: Negative for myalgias, joint swelling and arthralgias.  Skin: Negative for rash and wound.  Neurological: Negative for dizziness, weakness and headaches.  Hematological: Negative for adenopathy. Does not bruise/bleed easily.  Psychiatric/Behavioral: Negative for sleep disturbance and decreased concentration.   Past Medical History  Diagnosis Date  . Allergy   . Osteopenia    Past Surgical History  Procedure Date  . Tonsillectomy and adenoidectomy     reports that she has never smoked. She does not have any smokeless tobacco history on file. She reports that she does not drink alcohol or use illicit drugs. family history includes Alzheimer's disease in an unspecified family member; COPD in an unspecified family member; Cancer in an unspecified family member; Coronary artery disease in an unspecified family member; and Hypertension in an unspecified family member. Allergies  Allergen Reactions  . Sulfonamide Derivatives       Objective:   Physical Exam  Constitutional: She is oriented to person, place, and time. She appears well-developed and well-nourished.  No distress.  HENT:  Head: Normocephalic and atraumatic.  Right Ear: External ear normal.  Left Ear: External ear normal.  Nose: Nose normal.  Mouth/Throat: Oropharynx is clear and moist.  Eyes: Conjunctivae and EOM are normal. Pupils are equal, round, and reactive to light.  Neck: Normal range of motion. Neck supple. No JVD present. No tracheal deviation present. No thyromegaly present.  Cardiovascular: Normal rate, regular rhythm, normal heart sounds and intact distal pulses.   No murmur heard. Pulmonary/Chest: Effort normal and breath sounds normal. She has no wheezes. She exhibits no tenderness.  Abdominal: Soft. Bowel sounds are normal.  Musculoskeletal: Normal range of motion. She exhibits no edema and no tenderness.  Lymphadenopathy:    She has no cervical adenopathy.  Neurological: She is alert and oriented to person, place, and time. She has normal reflexes. No cranial nerve deficit.  Skin: Skin is warm and dry. She is not diaphoretic.  Psychiatric: She has a normal mood and affect. Her behavior is normal.          Assessment & Plan:   Monitoring of blood pressure and initiation of antihypertensive therapy

## 2011-05-21 NOTE — Patient Instructions (Signed)
Patient was instructed to continue all medications as prescribed. To stop at the checkout desk and schedule a followup appointment  

## 2011-11-13 ENCOUNTER — Ambulatory Visit (INDEPENDENT_AMBULATORY_CARE_PROVIDER_SITE_OTHER): Payer: BC Managed Care – PPO | Admitting: Family Medicine

## 2011-11-13 ENCOUNTER — Encounter: Payer: Self-pay | Admitting: Family Medicine

## 2011-11-13 VITALS — BP 158/82 | Temp 99.6°F | Wt 134.0 lb

## 2011-11-13 DIAGNOSIS — J329 Chronic sinusitis, unspecified: Secondary | ICD-10-CM

## 2011-11-13 DIAGNOSIS — J029 Acute pharyngitis, unspecified: Secondary | ICD-10-CM

## 2011-11-13 MED ORDER — AMOXICILLIN-POT CLAVULANATE 875-125 MG PO TABS: 1.0000 | ORAL_TABLET | Freq: Two times a day (BID) | ORAL | Status: AC

## 2011-11-13 NOTE — Patient Instructions (Signed)

## 2011-11-13 NOTE — Progress Notes (Signed)
  Subjective:    Patient ID: Carolyn Wise, female    DOB: [birth year], 61 y.o.   MRN: [id number]  HPI 6 weeks of of nasal congestion and stuffiness but thought was getting but suddenly on Thrusday night says her sinuses and throat started burning intensely.  No fever today but thinks had one yestreday.  Had post nasal drip the whole 6 weeks. + HA.  No GI sxs.  Drainage causes some loose stool occ.  Took Advil PM to help her sleep but still tossed and turned because of the sinus pain.  No worsening.  No hx of seasonal allergies.  Sulfa allergy.    Review of Systems     Objective:   Physical Exam  Constitutional: She is oriented to person, place, and time. She appears well-developed and well-nourished.  HENT:  Head: Normocephalic and atraumatic.  Right Ear: External ear normal.  Left Ear: External ear normal.  Nose: Nose normal.  Mouth/Throat: Oropharynx is clear and moist.       TMs and canals are clear. LN on the right side of her neck is very tender.   Eyes: Conjunctivae and EOM are normal. Pupils are equal, round, and reactive to light.  Neck: Neck supple. No thyromegaly present.  Cardiovascular: Normal rate, regular rhythm and normal heart sounds.   Pulmonary/Chest: Effort normal and breath sounds normal. She has no wheezes.  Lymphadenopathy:    She has no cervical adenopathy.  Neurological: She is alert and oriented to person, place, and time.  Skin: Skin is warm and dry.  Psychiatric: She has a normal mood and affect. Her behavior is normal.          Assessment & Plan:  Sinusitis - will tx with augmentin x 10 days. Call if not better in one week. Stay hydrated. Salt water gargles.  RUn humidifier.  Consider allergies if not getting better.  Phryngitis likely from post nasal drip. Rapid strep is neg.   LN - Asked her to keep an eye on this since very tender. If not resolving  In a couple of weeks, then have her PCP recheck

## 2011-12-07 ENCOUNTER — Telehealth: Payer: Self-pay | Admitting: Internal Medicine

## 2011-12-07 DIAGNOSIS — I10 Essential (primary) hypertension: Secondary | ICD-10-CM

## 2011-12-07 MED ORDER — BISOPROLOL-HYDROCHLOROTHIAZIDE 2.5-6.25 MG PO TABS: 1.0000 | ORAL_TABLET | Freq: Every day | ORAL | Status: DC

## 2011-12-07 NOTE — Telephone Encounter (Signed)
Sent in to [facility] [address]

## 2011-12-07 NOTE — Telephone Encounter (Signed)
Patient called stating that upon trying to receive a refill for her bisoprolol she was told that her rx has expired. Patient states she is completely out of this med and need a refill today. Please assist and inform patient when complete.

## 2012-01-07 ENCOUNTER — Other Ambulatory Visit: Payer: Self-pay | Admitting: Obstetrics and Gynecology

## 2012-01-07 DIAGNOSIS — Z1231 Encounter for screening mammogram for malignant neoplasm of breast: Secondary | ICD-10-CM

## 2012-01-18 ENCOUNTER — Ambulatory Visit (INDEPENDENT_AMBULATORY_CARE_PROVIDER_SITE_OTHER): Payer: BC Managed Care – PPO | Admitting: Internal Medicine

## 2012-01-18 ENCOUNTER — Encounter: Payer: Self-pay | Admitting: Internal Medicine

## 2012-01-18 VITALS — BP 138/80 | Temp 98.2°F | Wt 138.0 lb

## 2012-01-18 DIAGNOSIS — R300 Dysuria: Secondary | ICD-10-CM

## 2012-01-18 DIAGNOSIS — I10 Essential (primary) hypertension: Secondary | ICD-10-CM

## 2012-01-18 LAB — POCT URINALYSIS DIPSTICK
Bilirubin, UA: NEGATIVE
Glucose, UA: NEGATIVE
Spec Grav, UA: 1.015
Urobilinogen, UA: 0.2

## 2012-01-18 MED ORDER — NITROFURANTOIN MONOHYD MACRO 100 MG PO CAPS: 100.0000 mg | ORAL_CAPSULE | Freq: Two times a day (BID) | ORAL | Status: AC

## 2012-01-18 NOTE — Patient Instructions (Signed)
Drink as much fluid as you  can tolerate over the next few days  Take your antibiotic as prescribed until ALL of it is gone, but stop if you develop a rash, swelling, or any side effects of the medication.  Contact our office as soon as possible if  there are side effects of the medication.  Call or return to clinic prn if these symptoms worsen or fail to improve as anticipated.   

## 2012-01-18 NOTE — Progress Notes (Signed)
  Subjective:    Patient ID: Carolyn Wise, female    DOB: 06-29-1951, 61 y.o.   MRN: [id number]  HPI  61 year old patient has had prior UTIs in the past. She does have a sulfa allergy. For the past 3 days she has had burning dysuria and frequency. Symptoms are modest. No fever chills or flank pain. She will be leaving out of town on vacation within the next week    Review of Systems  Constitutional: Negative for fever and chills.  Genitourinary: Positive for dysuria, frequency and difficulty urinating. Negative for urgency, hematuria and flank pain.       Objective:   Physical Exam  Constitutional: She appears well-developed and well-nourished. No distress.       Repeat blood pressure 120/80 pulse 60-64          Assessment & Plan:   Acute UTI. Will treat with Macrobid for 7 days

## 2012-01-19 ENCOUNTER — Ambulatory Visit: Payer: BC Managed Care – PPO | Admitting: Family Medicine

## 2012-02-01 ENCOUNTER — Encounter: Payer: Self-pay | Admitting: Gynecology

## 2012-02-01 DIAGNOSIS — M8580 Other specified disorders of bone density and structure, unspecified site: Secondary | ICD-10-CM | POA: Insufficient documentation

## 2012-02-01 DIAGNOSIS — D219 Benign neoplasm of connective and other soft tissue, unspecified: Secondary | ICD-10-CM | POA: Insufficient documentation

## 2012-02-07 ENCOUNTER — Ambulatory Visit
Admission: RE | Admit: 2012-02-07 | Discharge: 2012-02-07 | Disposition: A | Discharge status: Home / Self Care | Payer: BC Managed Care – PPO | Source: Ambulatory Visit | Attending: Obstetrics and Gynecology | Admitting: Obstetrics and Gynecology

## 2012-02-07 DIAGNOSIS — Z1231 Encounter for screening mammogram for malignant neoplasm of breast: Secondary | ICD-10-CM

## 2012-02-15 ENCOUNTER — Other Ambulatory Visit: Payer: Self-pay | Admitting: *Deleted

## 2012-02-15 DIAGNOSIS — N63 Unspecified lump in unspecified breast: Secondary | ICD-10-CM

## 2012-02-17 ENCOUNTER — Ambulatory Visit
Admission: RE | Admit: 2012-02-17 | Discharge: 2012-02-17 | Disposition: A | Discharge status: Home / Self Care | Payer: BC Managed Care – PPO | Source: Ambulatory Visit | Attending: Obstetrics and Gynecology | Admitting: Obstetrics and Gynecology

## 2012-02-17 DIAGNOSIS — N63 Unspecified lump in unspecified breast: Secondary | ICD-10-CM

## 2012-02-21 ENCOUNTER — Other Ambulatory Visit (INDEPENDENT_AMBULATORY_CARE_PROVIDER_SITE_OTHER): Payer: BC Managed Care – PPO

## 2012-02-21 DIAGNOSIS — Z0000 Encounter for general adult medical examination without abnormal findings: Secondary | ICD-10-CM

## 2012-02-21 LAB — BASIC METABOLIC PANEL
BUN: 31 mg/dL — ABNORMAL HIGH (ref 6–23)
Chloride: 108 mEq/L (ref 96–112)
Glucose, Bld: 83 mg/dL (ref 70–99)
Potassium: 3.8 mEq/L (ref 3.5–5.1)

## 2012-02-21 LAB — POCT URINALYSIS DIPSTICK
Ketones, UA: NEGATIVE
Protein, UA: NEGATIVE
Spec Grav, UA: 1.02
Urobilinogen, UA: 0.2
pH, UA: 5

## 2012-02-21 LAB — CBC WITH DIFFERENTIAL/PLATELET
Basophils Absolute: 0 10*3/uL (ref 0.0–0.1)
Basophils Relative: 0.6 % (ref 0.0–3.0)
Eosinophils Absolute: 0.1 10*3/uL (ref 0.0–0.7)
MCHC: 33.3 g/dL (ref 30.0–36.0)
MCV: 87.1 fl (ref 78.0–100.0)
Monocytes Absolute: 0.3 10*3/uL (ref 0.1–1.0)
Neutro Abs: 3.7 10*3/uL (ref 1.4–7.7)
Neutrophils Relative %: 55.4 % (ref 43.0–77.0)
RBC: 4.03 Mil/uL (ref 3.87–5.11)
RDW: 13.8 % (ref 11.5–14.6)

## 2012-02-21 LAB — LIPID PANEL
Cholesterol: 222 mg/dL — ABNORMAL HIGH (ref 0–200)
HDL: 47.1 mg/dL (ref >39.00)
VLDL: 40 mg/dL (ref 0.0–40.0)

## 2012-02-21 LAB — HEPATIC FUNCTION PANEL
ALT: 14 U/L (ref 0–35)
AST: 16 U/L (ref 0–37)
Albumin: 3.9 g/dL (ref 3.5–5.2)
Total Protein: 7 g/dL (ref 6.0–8.3)

## 2012-02-23 ENCOUNTER — Encounter: Payer: Self-pay | Admitting: Obstetrics and Gynecology

## 2012-02-23 ENCOUNTER — Ambulatory Visit (INDEPENDENT_AMBULATORY_CARE_PROVIDER_SITE_OTHER): Payer: BC Managed Care – PPO | Admitting: Obstetrics and Gynecology

## 2012-02-23 VITALS — BP 112/64 | Ht 61.0 in | Wt 140.0 lb

## 2012-02-23 DIAGNOSIS — Z01419 Encounter for gynecological examination (general) (routine) without abnormal findings: Secondary | ICD-10-CM

## 2012-02-23 DIAGNOSIS — N6019 Diffuse cystic mastopathy of unspecified breast: Secondary | ICD-10-CM | POA: Insufficient documentation

## 2012-02-23 NOTE — Progress Notes (Signed)
Patient came to see me today for her annual GYN exam. When she went for her mammogram there was a mass seen in her left breast. It turned out to be a breast cyst and was aspirated with complete resolution of the cyst. She also has some scarring in her lower left breast from previous surgery but he suggested followup mammogram in 6 months to be sure especially with her family history of breast cancer. She is doing well without hormone replacement. She has always had normal Pap smears and has had a hysterectomy. Her last Pap smear was 2012. She has osteopenia on bone density without an elevated FRAX risk. She has not had a fracture. Her last bone density was 2012. She is having no vaginal bleeding or dyspareunia. She is having no pelvic pain.  HEENT: Within normal limits. Kennon Portela present. Neck: No masses. Supraclavicular lymph nodes: Not enlarged. Breasts: Examined in both sitting and lying position. Symmetrical without skin changes or masses. Abdomen: Soft no masses guarding or rebound. No hernias. Pelvic: External within normal limits. BUS within normal limits. Vaginal examination shows good estrogen effect, no cystocele enterocele or rectocele. Cervix and uterus absent. Adnexa within normal limits. Rectovaginal confirmatory. Extremities within normal limits.  Assessment: #1. Osteopenia #2. Breast cyst  Plan: Mammogram in 6 months. Bone density in 2014.The new Pap smear guidelines were discussed with the patient. No pap done.

## 2012-02-28 ENCOUNTER — Encounter: Payer: BC Managed Care – PPO | Admitting: Internal Medicine

## 2012-03-01 ENCOUNTER — Encounter: Payer: BC Managed Care – PPO | Admitting: Internal Medicine

## 2012-04-18 ENCOUNTER — Encounter: Payer: Self-pay | Admitting: Internal Medicine

## 2012-04-19 ENCOUNTER — Encounter: Payer: BC Managed Care – PPO | Admitting: Internal Medicine

## 2012-08-02 ENCOUNTER — Encounter: Payer: BC Managed Care – PPO | Admitting: Internal Medicine

## 2013-01-15 ENCOUNTER — Other Ambulatory Visit: Payer: Self-pay | Admitting: Family Medicine

## 2013-01-15 DIAGNOSIS — N6320 Unspecified lump in the left breast, unspecified quadrant: Secondary | ICD-10-CM

## 2013-02-07 ENCOUNTER — Ambulatory Visit
Admission: RE | Admit: 2013-02-07 | Discharge: 2013-02-07 | Disposition: A | Discharge status: Home / Self Care | Payer: BC Managed Care – PPO | Source: Ambulatory Visit | Attending: Family Medicine | Admitting: Family Medicine

## 2013-02-07 ENCOUNTER — Other Ambulatory Visit: Payer: Self-pay | Admitting: Family Medicine

## 2013-02-07 DIAGNOSIS — N6320 Unspecified lump in the left breast, unspecified quadrant: Secondary | ICD-10-CM

## 2013-06-13 ENCOUNTER — Other Ambulatory Visit: Payer: Self-pay | Admitting: Family Medicine

## 2013-06-13 DIAGNOSIS — N6310 Unspecified lump in the right breast, unspecified quadrant: Secondary | ICD-10-CM

## 2013-07-26 DIAGNOSIS — N183 Chronic kidney disease, stage 3 unspecified: Secondary | ICD-10-CM

## 2013-07-26 HISTORY — DX: Chronic kidney disease, stage 3 unspecified: N18.30

## 2013-09-11 ENCOUNTER — Ambulatory Visit
Admission: RE | Admit: 2013-09-11 | Discharge: 2013-09-11 | Disposition: A | Discharge status: Home / Self Care | Payer: BC Managed Care – PPO | Source: Ambulatory Visit | Attending: Family Medicine | Admitting: Family Medicine

## 2013-09-11 DIAGNOSIS — N6310 Unspecified lump in the right breast, unspecified quadrant: Secondary | ICD-10-CM

## 2014-01-10 ENCOUNTER — Other Ambulatory Visit: Payer: Self-pay | Admitting: Gastroenterology

## 2014-01-10 ENCOUNTER — Other Ambulatory Visit: Payer: Self-pay | Admitting: Family Medicine

## 2014-01-10 DIAGNOSIS — N6310 Unspecified lump in the right breast, unspecified quadrant: Secondary | ICD-10-CM

## 2014-02-08 ENCOUNTER — Ambulatory Visit
Admission: RE | Admit: 2014-02-08 | Discharge: 2014-02-08 | Disposition: A | Discharge status: Home / Self Care | Payer: BC Managed Care – PPO | Source: Ambulatory Visit | Attending: Family Medicine | Admitting: Family Medicine

## 2014-02-08 ENCOUNTER — Other Ambulatory Visit: Payer: Self-pay | Admitting: Family Medicine

## 2014-02-08 DIAGNOSIS — N6310 Unspecified lump in the right breast, unspecified quadrant: Secondary | ICD-10-CM

## 2014-02-21 ENCOUNTER — Other Ambulatory Visit: Payer: Self-pay | Admitting: Family Medicine

## 2014-02-21 DIAGNOSIS — N6310 Unspecified lump in the right breast, unspecified quadrant: Secondary | ICD-10-CM

## 2014-02-25 ENCOUNTER — Ambulatory Visit
Admission: RE | Admit: 2014-02-25 | Discharge: 2014-02-25 | Disposition: A | Discharge status: Home / Self Care | Payer: BC Managed Care – PPO | Source: Ambulatory Visit | Attending: Family Medicine | Admitting: Family Medicine

## 2014-02-25 DIAGNOSIS — N6310 Unspecified lump in the right breast, unspecified quadrant: Secondary | ICD-10-CM

## 2014-03-25 ENCOUNTER — Emergency Department (HOSPITAL_COMMUNITY)
Admission: EM | Admit: 2014-03-25 | Discharge: 2014-03-25 | Disposition: A | Discharge status: Home / Self Care | Payer: BC Managed Care – PPO | Attending: Emergency Medicine | Admitting: Emergency Medicine

## 2014-03-25 ENCOUNTER — Emergency Department (HOSPITAL_COMMUNITY): Payer: BC Managed Care – PPO

## 2014-03-25 ENCOUNTER — Encounter (HOSPITAL_COMMUNITY): Payer: Self-pay | Admitting: Emergency Medicine

## 2014-03-25 DIAGNOSIS — Z8542 Personal history of malignant neoplasm of other parts of uterus: Secondary | ICD-10-CM | POA: Diagnosis not present

## 2014-03-25 DIAGNOSIS — Z79899 Other long term (current) drug therapy: Secondary | ICD-10-CM | POA: Insufficient documentation

## 2014-03-25 DIAGNOSIS — Z8742 Personal history of other diseases of the female genital tract: Secondary | ICD-10-CM | POA: Insufficient documentation

## 2014-03-25 DIAGNOSIS — Z792 Long term (current) use of antibiotics: Secondary | ICD-10-CM | POA: Diagnosis not present

## 2014-03-25 DIAGNOSIS — K625 Hemorrhage of anus and rectum: Secondary | ICD-10-CM | POA: Diagnosis present

## 2014-03-25 DIAGNOSIS — K529 Noninfective gastroenteritis and colitis, unspecified: Secondary | ICD-10-CM

## 2014-03-25 DIAGNOSIS — I10 Essential (primary) hypertension: Secondary | ICD-10-CM | POA: Diagnosis not present

## 2014-03-25 DIAGNOSIS — K5289 Other specified noninfective gastroenteritis and colitis: Secondary | ICD-10-CM | POA: Diagnosis not present

## 2014-03-25 DIAGNOSIS — Z8739 Personal history of other diseases of the musculoskeletal system and connective tissue: Secondary | ICD-10-CM | POA: Insufficient documentation

## 2014-03-25 LAB — CBC WITH DIFFERENTIAL/PLATELET
BASOS ABS: 0 10*3/uL (ref 0.0–0.1)
BASOS PCT: 0 % (ref 0–1)
EOS ABS: 0.1 10*3/uL (ref 0.0–0.7)
EOS PCT: 0 % (ref 0–5)
HEMATOCRIT: 39.6 % (ref 36.0–46.0)
Hemoglobin: 13 g/dL (ref 12.0–15.0)
Lymphocytes Relative: 11 % — ABNORMAL LOW (ref 12–46)
Lymphs Abs: 2.5 10*3/uL (ref 0.7–4.0)
MCH: 28 pg (ref 26.0–34.0)
MCHC: 32.8 g/dL (ref 30.0–36.0)
MCV: 85.2 fL (ref 78.0–100.0)
MONO ABS: 0.6 10*3/uL (ref 0.1–1.0)
Monocytes Relative: 3 % (ref 3–12)
NEUTROS ABS: 19.2 10*3/uL — AB (ref 1.7–7.7)
Neutrophils Relative %: 86 % — ABNORMAL HIGH (ref 43–77)
Platelets: 336 10*3/uL (ref 150–400)
RBC: 4.65 MIL/uL (ref 3.87–5.11)
RDW: 14.6 % (ref 11.5–15.5)
WBC: 22.5 10*3/uL — ABNORMAL HIGH (ref 4.0–10.5)

## 2014-03-25 LAB — COMPREHENSIVE METABOLIC PANEL
ALBUMIN: 4.3 g/dL (ref 3.5–5.2)
ALT: 18 U/L (ref 0–35)
AST: 23 U/L (ref 0–37)
Alkaline Phosphatase: 100 U/L (ref 39–117)
Anion gap: 17 — ABNORMAL HIGH (ref 5–15)
BUN: 20 mg/dL (ref 6–23)
CALCIUM: 9.8 mg/dL (ref 8.4–10.5)
CHLORIDE: 102 meq/L (ref 96–112)
CO2: 19 mEq/L (ref 19–32)
CREATININE: 1.08 mg/dL (ref 0.50–1.10)
GFR calc Af Amer: 63 mL/min — ABNORMAL LOW (ref >90)
GFR calc non Af Amer: 54 mL/min — ABNORMAL LOW (ref >90)
Glucose, Bld: 123 mg/dL — ABNORMAL HIGH (ref 70–99)
Potassium: 3.9 mEq/L (ref 3.7–5.3)
Sodium: 138 mEq/L (ref 137–147)
TOTAL PROTEIN: 7.9 g/dL (ref 6.0–8.3)
Total Bilirubin: 0.3 mg/dL (ref 0.3–1.2)

## 2014-03-25 LAB — TYPE AND SCREEN
ABO/RH(D): A POS
ANTIBODY SCREEN: NEGATIVE

## 2014-03-25 LAB — PROTIME-INR
INR: 0.99 (ref 0.00–1.49)
PROTHROMBIN TIME: 13.1 s (ref 11.6–15.2)

## 2014-03-25 LAB — ABO/RH: ABO/RH(D): A POS

## 2014-03-25 MED ORDER — MORPHINE SULFATE 4 MG/ML IJ SOLN
4.0000 mg | Freq: Once | INTRAMUSCULAR | Status: AC
  Administered 2014-03-25: 4 mg via INTRAVENOUS
  Filled 2014-03-25: qty 1

## 2014-03-25 MED ORDER — METRONIDAZOLE IN NACL 5-0.79 MG/ML-% IV SOLN
500.0000 mg | Freq: Once | INTRAVENOUS | Status: AC
  Administered 2014-03-25: 500 mg via INTRAVENOUS
  Filled 2014-03-25: qty 100

## 2014-03-25 MED ORDER — HYDROCODONE-ACETAMINOPHEN 5-325 MG PO TABS: 2.0000 | ORAL_TABLET | Freq: Four times a day (QID) | ORAL | Status: DC | PRN

## 2014-03-25 MED ORDER — ONDANSETRON HCL 4 MG/2ML IJ SOLN
4.0000 mg | Freq: Once | INTRAMUSCULAR | Status: AC
  Administered 2014-03-25: 4 mg via INTRAVENOUS
  Filled 2014-03-25: qty 2

## 2014-03-25 MED ORDER — CIPROFLOXACIN HCL 500 MG PO TABS: 500.0000 mg | ORAL_TABLET | Freq: Two times a day (BID) | ORAL | Status: DC

## 2014-03-25 MED ORDER — SODIUM CHLORIDE 0.9 % IV BOLUS (SEPSIS)
1000.0000 mL | Freq: Once | INTRAVENOUS | Status: AC
  Administered 2014-03-25: 1000 mL via INTRAVENOUS

## 2014-03-25 MED ORDER — CIPROFLOXACIN IN D5W 400 MG/200ML IV SOLN
400.0000 mg | Freq: Once | INTRAVENOUS | Status: AC
  Administered 2014-03-25: 400 mg via INTRAVENOUS
  Filled 2014-03-25: qty 200

## 2014-03-25 MED ORDER — IOHEXOL 300 MG/ML  SOLN
50.0000 mL | Freq: Once | INTRAMUSCULAR | Status: AC | PRN
  Administered 2014-03-25: 50 mL via ORAL

## 2014-03-25 MED ORDER — METRONIDAZOLE 500 MG PO TABS: 500.0000 mg | ORAL_TABLET | Freq: Two times a day (BID) | ORAL | Status: DC

## 2014-03-25 NOTE — ED Notes (Signed)
Left forearm 20 G IV not documented by previous shift removed per protocol. Site clean, dry, and intact prior to removal.

## 2014-03-25 NOTE — ED Provider Notes (Signed)
Patient is a 63 y.o female who presents to the ED with abdominal pain and bloody diarrhea.  Patient was signed out to me by Junius Creamer NP at shift change.  Plan is to allow patient to have IV cipro and flagyl here.  Patient to be discharged home with cipro, flagyl, and norco and to follow-up with GI.  Patient is aware of the plan and is in agreement.  Baker Janus has spoken with Dr. Sharol Given who is aware and in agreement with the above plan.  Patient was told to return for passing of large amounts of blood per rectum, intractable pain despite pain medication, and intractable nausea and vomiting.  Patient states understanding and agreement.    Patient was examined prior to discharge.  She is alert and oriented x 3.  Heart RRR no M/R/G.  Lungs clear to auscultation.  Abdomen with normal bowel sounds x 4, soft, and minimally tender in LLQ.  Patient is stable for discharge at this time.    Cherylann Parr, PA-C 03/25/14 [phone]

## 2014-03-25 NOTE — Discharge Instructions (Signed)

## 2014-03-25 NOTE — ED Notes (Signed)
Pt husband states pt was moving some boxes around 4pm today. Pt began having pain and laid in the floor. Pt c/o nausea. Pt husband states pt kept having to use the bathroom and noticed blood in pt stool. PCP told pt to come to the ED. Pt reports having these same symptoms when her nephew was dying in the hospital a couple of years ago. Pt is unsure if she caught something at that point that has stayed in her system. Pt has been to the bathroom multiple times since arrival to ED. Pt reports there is now more blood than it has been. Pt husband says she has been having diarrhea as well. Pt states her stomach cramps up and then she has to use the bathroom. Pt reports pain cramping in lower abdomen.

## 2014-03-25 NOTE — ED Provider Notes (Signed)
CSN: [id number]     Arrival date & time 03/25/14  0024 History   First MD Initiated Contact with Patient 03/25/14 0115     Chief Complaint  Patient presents with  . Rectal Bleeding     (Consider location/radiation/quality/duration/timing/severity/associated sxs/prior Treatment) Patient is a 63 y.o. female presenting with hematochezia. The history is provided by the patient.  Rectal Bleeding Quality:  Bright red Amount:  Moderate Duration:  8 hours Timing:  Sporadic Progression:  Waxing and waning Chronicity:  New Context: diarrhea   Context: not anal fissures, not rectal injury and not rectal pain   Similar prior episodes: yes   Relieved by:  Nothing Worsened by:  Defecation Ineffective treatments: Pepto Bismol. Associated symptoms: abdominal pain   Associated symptoms: no dizziness, no fever, no recent illness and no vomiting   Abdominal pain:    Location:  Suprapubic, LLQ and RLQ   Quality:  Cramping   Severity:  Moderate   Onset quality:  Gradual   Duration:  8 hours   Timing:  Intermittent   Progression:  Unchanged   Chronicity:  New Risk factors: no anticoagulant use, no hx of colorectal cancer, no hx of IBD, no liver disease, no NSAID use and no steroid use     Past Medical History  Diagnosis Date  . Allergy   . Osteopenia   . Fibroid   . Hypertension   . Breast fibrocystic disorder    Past Surgical History  Procedure Laterality Date  . Tonsillectomy and adenoidectomy    . Abdominal hysterectomy  1999    TAH  . Tubal ligation    . Breast surgery      Biopsy--Fibroadenoma  . Artery repair surg  1981   Family History  Problem Relation Age of Onset  . Breast cancer Mother     Age 79  . Heart disease Mother   . Alzheimer's disease Mother   . Heart disease Father   . COPD Father   . Heart disease Brother    History  Substance Use Topics  . Smoking status: Never Smoker   . Smokeless tobacco: Not on file  . Alcohol Use: No   OB History   Grav  Para Term Preterm Abortions TAB SAB Ect Mult Living   2 2 1 1      2      Review of Systems  Constitutional: Negative for fever.  Gastrointestinal: Positive for nausea, abdominal pain, diarrhea, blood in stool and hematochezia. Negative for vomiting.  Genitourinary: Negative for dysuria.  Skin: Negative for rash.  Neurological: Negative for dizziness.  All other systems reviewed and are negative.     Allergies  Sulfonamide derivatives  Home Medications   Prior to Admission medications   Medication Sig Start Date End Date Taking? Authorizing Provider  Ascorbic Acid (VITAMIN C) 100 MG tablet Take 100 mg by mouth daily.     Yes Historical Provider, MD  b complex vitamins tablet Take 1 tablet by mouth daily.     Yes Historical Provider, MD  bismuth subsalicylate (PEPTO BISMOL) 262 MG/15ML suspension Take 30 mLs by mouth every 6 (six) hours as needed for indigestion.   Yes Historical Provider, MD  Calcium-Vitamin D (SUPER CALCIUM/D) 600-125 MG-UNIT TABS Take by mouth daily.     Yes Historical Provider, MD  cholecalciferol (VITAMIN D) 1000 UNITS tablet Take 1,000 Units by mouth daily.     Yes Historical Provider, MD  lisinopril (PRINIVIL,ZESTRIL) 10 MG tablet Take 1 tablet by mouth daily.  01/21/14  Yes Historical Provider, MD  vitamin E 100 UNIT capsule Take 100 Units by mouth daily.     Yes Historical Provider, MD  bisoprolol-hydrochlorothiazide (ZIAC) 2.5-6.25 MG per tablet Take 1 tablet by mouth daily. 12/07/11 12/06/12  Ricard Dillon, MD  ciprofloxacin (CIPRO) 500 MG tablet Take 1 tablet (500 mg total) by mouth 2 (two) times daily. 03/25/14   Garald Balding, NP  HYDROcodone-acetaminophen (NORCO/VICODIN) 5-325 MG per tablet Take 2 tablets by mouth every 6 (six) hours as needed for moderate pain or severe pain. 03/25/14   Garald Balding, NP  metroNIDAZOLE (FLAGYL) 500 MG tablet Take 1 tablet (500 mg total) by mouth 2 (two) times daily. 03/25/14   Garald Balding, NP   BP 159/75  Pulse 72   Temp(Src) 98.6 F (37 C) (Oral)  Resp 16  SpO2 100% Physical Exam  Nursing note and vitals reviewed. Constitutional: She appears well-developed and well-nourished.  HENT:  Head: Normocephalic.  Eyes: Pupils are equal, round, and reactive to light.  Neck: Normal range of motion.  Cardiovascular: Normal rate and regular rhythm.   Pulmonary/Chest: Effort normal. No respiratory distress. She exhibits no tenderness.  Abdominal: Soft. She exhibits no distension. There is tenderness in the right lower quadrant, suprapubic area and left lower quadrant. There is no guarding.  Bright red blood in toilet after BM  No stool  Musculoskeletal: Normal range of motion.  Skin: Skin is warm.    ED Course  Procedures (including critical care time) Labs Review Labs Reviewed  CBC WITH DIFFERENTIAL - Abnormal; Notable for the following:    WBC 22.5 (*)    Neutrophils Relative % 86 (*)    Neutro Abs 19.2 (*)    Lymphocytes Relative 11 (*)    All other components within normal limits  COMPREHENSIVE METABOLIC PANEL - Abnormal; Notable for the following:    Glucose, Bld 123 (*)    GFR calc non Af Amer 54 (*)    GFR calc Af Amer 63 (*)    Anion gap 17 (*)    All other components within normal limits  PROTIME-INR  TYPE AND SCREEN  ABO/RH    Imaging Review Ct Abdomen Pelvis Wo Contrast  03/25/2014   CLINICAL DATA:  Rectal bleeding, diarrhea, leukocytosis, low pelvic pain. History of fibroids.  EXAM: CT ABDOMEN AND PELVIS WITHOUT CONTRAST  TECHNIQUE: Multidetector CT imaging of the abdomen and pelvis was performed following the standard protocol without IV contrast.  COMPARISON:  None.  FINDINGS: Lung bases clear.  Normal heart size.  Limited organ evaluation without intravenous contrast. Within this limitation, no appreciable abnormality of the liver, spleen, pancreas, adrenal glands.  Gallbladder fundal sludge and/or noncalcified stones. No pericholecystic fluid or fat stranding. No biliary ductal  dilatation.  Symmetric renal size. No urinary tract calculi. No hydroureteronephrosis.  Sigmoid colonic wall thickening and pericolonic fat stranding/ ill-defined fluid. No overt diverticula. No bowel wall pneumatosis. No free intraperitoneal air. Normal appendix. Small bowel loops are of normal caliber and course. No lymphadenopathy.  Normal caliber aorta and branch vessels with mild scattered atherosclerotic disease.  Partially decompressed bladder.  Absent uterus.  No adnexal mass.  Mild multilevel degenerative changes.  No acute osseous finding.  IMPRESSION: Sigmoid colonic wall thickening and pericolonic fat stranding is most in keeping with a nonspecific colitis (infectious, inflammatory, and ischemic considerations).  Gallbladder sludge and/or stones.  No CT evidence for cholecystitis.   Electronically Signed   By: Delano Metz.D.  On: 03/25/2014 05:46     EKG Interpretation None      MDM   Final diagnoses:  Colitis         Garald Balding, NP 03/25/14 2009

## 2014-03-26 NOTE — ED Provider Notes (Signed)
Medical screening examination/treatment/procedure(s) were performed by non-physician practitioner and as supervising physician I was immediately available for consultation/collaboration.   EKG Interpretation None       Kalman Drape, MD 03/26/14 [phone]

## 2014-03-26 NOTE — ED Provider Notes (Signed)
Medical screening examination/treatment/procedure(s) were performed by non-physician practitioner and as supervising physician I was immediately available for consultation/collaboration.   EKG Interpretation None       Kalman Drape, MD 03/26/14 [phone]

## 2014-05-27 ENCOUNTER — Encounter (HOSPITAL_COMMUNITY): Payer: Self-pay | Admitting: Emergency Medicine

## 2014-12-31 ENCOUNTER — Other Ambulatory Visit: Payer: Self-pay | Admitting: Family Medicine

## 2014-12-31 DIAGNOSIS — D241 Benign neoplasm of right breast: Secondary | ICD-10-CM

## 2015-01-29 ENCOUNTER — Encounter: Payer: Self-pay | Admitting: Women's Health

## 2015-01-29 ENCOUNTER — Ambulatory Visit (INDEPENDENT_AMBULATORY_CARE_PROVIDER_SITE_OTHER): Payer: BC Managed Care – PPO | Admitting: Women's Health

## 2015-01-29 VITALS — BP 118/70 | Ht 61.0 in | Wt 138.0 lb

## 2015-01-29 DIAGNOSIS — Z01419 Encounter for gynecological examination (general) (routine) without abnormal findings: Secondary | ICD-10-CM

## 2015-01-29 LAB — URINALYSIS W MICROSCOPIC + REFLEX CULTURE
Bacteria, UA: NONE SEEN
CRYSTALS: NONE SEEN
Casts: NONE SEEN
GLUCOSE, UA: NEGATIVE mg/dL
Hgb urine dipstick: NEGATIVE
Ketones, ur: NEGATIVE mg/dL
Nitrite: NEGATIVE
PROTEIN: NEGATIVE mg/dL
SPECIFIC GRAVITY, URINE: 1.021 (ref 1.005–1.030)
UROBILINOGEN UA: 0.2 mg/dL (ref 0.0–1.0)
pH: 5 (ref 5.0–8.0)

## 2015-01-29 NOTE — Patient Instructions (Signed)
Health Recommendations for Postmenopausal Women Respected and ongoing research has looked at the most common causes of death, disability, and poor quality of life in postmenopausal women. The causes include heart disease, diseases of blood vessels, diabetes, depression, cancer, and bone loss (osteoporosis). Many things can be done to help lower the chances of developing these and other common problems. CARDIOVASCULAR DISEASE Heart Disease: A heart attack is a medical emergency. Know the signs and symptoms of a heart attack. Below are things women can do to reduce their risk for heart disease.   Do not smoke. If you smoke, quit.  Aim for a healthy weight. Being overweight causes many preventable deaths. Eat a healthy and balanced diet and drink an adequate amount of liquids.  Get moving. Make a commitment to be more physically active. Aim for 30 minutes of activity on most, if not all days of the week.  Eat for heart health. Choose a diet that is low in saturated fat and cholesterol and eliminate trans fat. Include whole grains, vegetables, and fruits. Read and understand the labels on food containers before buying.  Know your numbers. Ask your caregiver to check your blood pressure, cholesterol (total, HDL, LDL, triglycerides) and blood glucose. Work with your caregiver on improving your entire clinical picture.  High blood pressure. Limit or stop your table salt intake (try salt substitute and food seasonings). Avoid salty foods and drinks. Read labels on food containers before buying. Eating well and exercising can help control high blood pressure. STROKE  Stroke is a medical emergency. Stroke may be the result of a blood clot in a blood vessel in the brain or by a brain hemorrhage (bleeding). Know the signs and symptoms of a stroke. To lower the risk of developing a stroke:  Avoid fatty foods.  Quit smoking.  Control your diabetes, blood pressure, and irregular heart rate. THROMBOPHLEBITIS  (BLOOD CLOT) OF THE LEG  Becoming overweight and leading a stationary lifestyle may also contribute to developing blood clots. Controlling your diet and exercising will help lower the risk of developing blood clots. CANCER SCREENING  Breast Cancer: Take steps to reduce your risk of breast cancer.  You should practice "breast self-awareness." This means understanding the normal appearance and feel of your breasts and should include breast self-examination. Any changes detected, no matter how small, should be reported to your caregiver.  After age 40, you should have a clinical breast exam (CBE) every year.  Starting at age 40, you should consider having a mammogram (breast X-ray) every year.  If you have a family history of breast cancer, talk to your caregiver about genetic screening.  If you are at high risk for breast cancer, talk to your caregiver about having an MRI and a mammogram every year.  Intestinal or Stomach Cancer: Tests to consider are a rectal exam, fecal occult blood, sigmoidoscopy, and colonoscopy. Women who are high risk may need to be screened at an earlier age and more often.  Cervical Cancer:  Beginning at age 30, you should have a Pap test every 3 years as long as the past 3 Pap tests have been normal.  If you have had past treatment for cervical cancer or a condition that could lead to cancer, you need Pap tests and screening for cancer for at least 20 years after your treatment.  If you had a hysterectomy for a problem that was not cancer or a condition that could lead to cancer, then you no longer need Pap tests.    If you are between ages 65 and 70, and you have had normal Pap tests going back 10 years, you no longer need Pap tests.  If Pap tests have been discontinued, risk factors (such as a new sexual partner) need to be reassessed to determine if screening should be resumed.  Some medical problems can increase the chance of getting cervical cancer. In these  cases, your caregiver may recommend more frequent screening and Pap tests.  Uterine Cancer: If you have vaginal bleeding after reaching menopause, you should notify your caregiver.  Ovarian Cancer: Other than yearly pelvic exams, there are no reliable tests available to screen for ovarian cancer at this time except for yearly pelvic exams.  Lung Cancer: Yearly chest X-rays can detect lung cancer and should be done on high risk women, such as cigarette smokers and women with chronic lung disease (emphysema).  Skin Cancer: A complete body skin exam should be done at your yearly examination. Avoid overexposure to the sun and ultraviolet light lamps. Use a strong sun block cream when in the sun. All of these things are important for lowering the risk of skin cancer. MENOPAUSE Menopause Symptoms: Hormone therapy products are effective for treating symptoms associated with menopause:  Moderate to severe hot flashes.  Night sweats.  Mood swings.  Headaches.  Tiredness.  Loss of sex drive.  Insomnia.  Other symptoms. Hormone replacement carries certain risks, especially in older women. Women who use or are thinking about using estrogen or estrogen with progestin treatments should discuss that with their caregiver. Your caregiver will help you understand the benefits and risks. The ideal dose of hormone replacement therapy is not known. The Food and Drug Administration (FDA) has concluded that hormone therapy should be used only at the lowest doses and for the shortest amount of time to reach treatment goals.  OSTEOPOROSIS Protecting Against Bone Loss and Preventing Fracture If you use hormone therapy for prevention of bone loss (osteoporosis), the risks for bone loss must outweigh the risk of the therapy. Ask your caregiver about other medications known to be safe and effective for preventing bone loss and fractures. To guard against bone loss or fractures, the following is recommended:  If  you are younger than age 50, take 1000 mg of calcium and at least 600 mg of Vitamin D per day.  If you are older than age 50 but younger than age 70, take 1200 mg of calcium and at least 600 mg of Vitamin D per day.  If you are older than age 70, take 1200 mg of calcium and at least 800 mg of Vitamin D per day. Smoking and excessive alcohol intake increases the risk of osteoporosis. Eat foods rich in calcium and vitamin D and do weight bearing exercises several times a week as your caregiver suggests. DIABETES Diabetes Mellitus: If you have type I or type 2 diabetes, you should keep your blood sugar under control with diet, exercise, and recommended medication. Avoid starchy and fatty foods, and too many sweets. Being overweight can make diabetes control more difficult. COGNITION AND MEMORY Cognition and Memory: Menopausal hormone therapy is not recommended for the prevention of cognitive disorders such as Alzheimer's disease or memory loss.  DEPRESSION  Depression may occur at any age, but it is common in elderly women. This may be because of physical, medical, social (loneliness), or financial problems and needs. If you are experiencing depression because of medical problems and control of symptoms, talk to your caregiver about this. Physical   activity and exercise may help with mood and sleep. Community and volunteer involvement may improve your sense of value and worth. If you have depression and you feel that the problem is getting worse or becoming severe, talk to your caregiver about which treatment options are best for you. ACCIDENTS  Accidents are common and can be serious in elderly woman. Prepare your house to prevent accidents. Eliminate throw rugs, place hand bars in bath, shower, and toilet areas. Avoid wearing high heeled shoes or walking on wet, snowy, and icy areas. Limit or stop driving if you have vision or hearing problems, or if you feel you are unsteady with your movements and  reflexes. HEPATITIS C Hepatitis C is a type of viral infection affecting the liver. It is spread mainly through contact with blood from an infected person. It can be treated, but if left untreated, it can lead to severe liver damage over the years. Many people who are infected do not know that the virus is in their blood. If you are a "baby-boomer", it is recommended that you have one screening test for Hepatitis C. IMMUNIZATIONS  Several immunizations are important to consider having during your senior years, including:   Tetanus, diphtheria, and pertussis booster shot.  Influenza every year before the flu season begins.  Pneumonia vaccine.  Shingles vaccine.  Others, as indicated based on your specific needs. Talk to your caregiver about these. Document Released: 09/03/2005 Document Revised: 11/26/2013 Document Reviewed: 04/29/2008 Exit[employer] Patient Information 2015 [employer]. This information is not intended to replace advice given to you by your health care provider. Make sure you discuss any questions you have with your health care provider.  

## 2015-01-29 NOTE — Progress Notes (Signed)
Carolyn Wise 02/20/1951 [id number]    History:    Presents for annual exam. Complains of dyspareunia due to vaginal dryness. Postmenopausal/No HRT/1999 TAH due to fibroids/normal PAP history. 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 2012 DEXA - T-score -1.7, FRAX 16%/0.8%.  2011 Colonoscopy negative. Hyperlipidemia and HTN/lisinopril/meds, labs, vaccines managed by [facility]. Regular exercise and healthy diet. 2015 ED visit for bloody diarrhea and abdominal pain/dx infectious colitis/resolved.   Past medical history, past surgical history, family history and social history were all reviewed and documented in the EPIC chart. Kindergarten Pharmacist, hospital in [employer]. Family history of heart disease, HTN. Mother died at 64 of breast cancer. 4 grandchildren.   ROS:  A ROS was performed and pertinent positives and negatives are included.  Exam:  Filed Vitals:   01/29/15 0850  BP: 118/70    General appearance:  Normal Thyroid:  Symmetrical, normal in size, without palpable masses or nodularity. Respiratory  Auscultation:  Clear without wheezing or rhonchi Cardiovascular  Auscultation:  Regular rate, without rubs, murmurs or gallops  Edema/varicosities:  Not grossly evident Abdominal  Soft,nontender, without masses, guarding or rebound.  Liver/spleen:  No organomegaly noted  Hernia:  None appreciated  Skin  Inspection:  Grossly normal   Breasts: Examined lying and sitting.     Right: Without masses, retractions, discharge or axillary adenopathy.     Left: Without masses, retractions, discharge or axillary adenopathy. Gentitourinary   Inguinal/mons:  Normal without inguinal adenopathy  External genitalia:  Normal  BUS/Urethra/Skene's glands:  Normal  Vagina:  Normal  Cervix:  Absent  Uterus: Absent  Adnexa/parametria:     Rt: Without masses or tenderness.   Lt: Without masses or tenderness.  Anus and perineum: Normal  Digital rectal exam: Normal sphincter tone without palpated masses or  tenderness  Assessment/Plan:  64 y.o. MWF G2P2 for annual exam.  Complains of vaginal dryness.   Vaginal dryness/dyspareunia  1999 TAH-fibroids no HRT 2015 Fibroadenoma/benign breast biopsy Osteopenia/2012 DEXA T-score -1.7 without elevated FRAX HTN and Hyperlipidemia primary care manages labs and meds  Plan: OTC lubricant options discussed for dyspareunia. Continue SBE's and annual screening mammogram, colonoscopy and DEXA screenings, instructed to schedule. Home safety and falls prevention discussed. Fish oil and Red Yeast rice discussed as supplements for hyperlipidemia/continue regular exercise and heart healthy diet. Continue regular follow-up with [facility] for management of meds/labs/vaccines. UA. Normal PAP history/PAP guidelines reviewed.   [name] [facility], 9:39 AM 01/29/2015

## 2015-01-31 ENCOUNTER — Other Ambulatory Visit: Payer: Self-pay | Admitting: Women's Health

## 2015-01-31 ENCOUNTER — Telehealth: Payer: Self-pay

## 2015-01-31 LAB — URINE CULTURE: Colony Count: >=100000

## 2015-01-31 MED ORDER — NITROFURANTOIN MONOHYD MACRO 100 MG PO CAPS: 100.0000 mg | ORAL_CAPSULE | Freq: Two times a day (BID) | ORAL | Status: DC

## 2015-01-31 NOTE — Telephone Encounter (Signed)
-----   Message from [name], NP sent at 01/30/2015  4:49 PM EDT ----- Please call and review urine culture positive for infection, Macrobid twice daily for 7 days #14. If asymptomatic can recheck clean-catch urine prior to treatment.

## 2015-01-31 NOTE — Telephone Encounter (Signed)
Patient informed of result below.  She said sometimes she has symptoms but they come and go. Right now she has no symptoms but in 2 days she might.  What to rec she do?  Also, she had a question. She said at visit you recommended a supplement to help lower her triglycerides and she wondered what that was?

## 2015-01-31 NOTE — Telephone Encounter (Signed)
I left detailed message on voice mail and told her I would send Rx to her pharmacy and to call if any questions.

## 2015-01-31 NOTE — Telephone Encounter (Signed)
Have her take the Macrobid twice daily for 7 days with food. Fish oil supplement over-the-counter [employer] is okay twice daily.

## 2015-02-10 ENCOUNTER — Ambulatory Visit
Admission: RE | Admit: 2015-02-10 | Discharge: 2015-02-10 | Disposition: A | Discharge status: Home / Self Care | Payer: BC Managed Care – PPO | Source: Ambulatory Visit | Attending: Family Medicine | Admitting: Family Medicine

## 2015-02-10 DIAGNOSIS — D241 Benign neoplasm of right breast: Secondary | ICD-10-CM

## 2016-01-01 ENCOUNTER — Other Ambulatory Visit: Payer: Self-pay | Admitting: Physician Assistant

## 2016-01-01 ENCOUNTER — Other Ambulatory Visit: Payer: Self-pay | Admitting: Women's Health

## 2016-01-01 DIAGNOSIS — Z1231 Encounter for screening mammogram for malignant neoplasm of breast: Secondary | ICD-10-CM

## 2016-02-13 ENCOUNTER — Ambulatory Visit
Admission: RE | Admit: 2016-02-13 | Discharge: 2016-02-13 | Disposition: A | Discharge status: Home / Self Care | Payer: BC Managed Care – PPO | Source: Ambulatory Visit | Attending: Physician Assistant | Admitting: Physician Assistant

## 2016-02-13 DIAGNOSIS — Z1231 Encounter for screening mammogram for malignant neoplasm of breast: Secondary | ICD-10-CM

## 2016-02-18 ENCOUNTER — Ambulatory Visit (INDEPENDENT_AMBULATORY_CARE_PROVIDER_SITE_OTHER): Payer: BC Managed Care – PPO | Admitting: Women's Health

## 2016-02-18 ENCOUNTER — Encounter: Payer: Self-pay | Admitting: Women's Health

## 2016-02-18 VITALS — BP 122/80 | Ht 61.0 in | Wt 142.0 lb

## 2016-02-18 DIAGNOSIS — Z01419 Encounter for gynecological examination (general) (routine) without abnormal findings: Secondary | ICD-10-CM

## 2016-02-18 NOTE — Patient Instructions (Signed)

## 2016-02-18 NOTE — Progress Notes (Signed)
Carolyn Wise [birth year] [id number]    History:    Presents for annual exam.  1999 TAH for fibroids on no HRT. 2015 benign breast biopsy fibroadenoma. Normal Pap history. 2012 T score -1.7 FRAX 16%/0.8%. 2011 negative colonoscopy. Mother died from breast cancer at age 64. Has not received Zostavax is waiting until 26. Hypercholesterolemia and hypertension managed by primary care.  Past medical history, past surgical history, family history and social history were all reviewed and documented in the EPIC chart. 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 All doing well. Husband had open heart surgery in 2016 doing well.  ROS:  A ROS was performed and pertinent positives and negatives are included.  Exam:  Vitals:   02/18/16 1550  BP: 122/80    General appearance:  Normal Thyroid:  Symmetrical, normal in size, without palpable masses or nodularity. Respiratory  Auscultation:  Clear without wheezing or rhonchi Cardiovascular  Auscultation:  Regular rate, without rubs, murmurs or gallops  Edema/varicosities:  Not grossly evident Abdominal  Soft,nontender, without masses, guarding or rebound.  Liver/spleen:  No organomegaly noted  Hernia:  None appreciated  Skin  Inspection:  Grossly normal   Breasts: Examined lying and sitting.     Right: Without masses, retractions, discharge or axillary adenopathy.     Left: Without masses, retractions, discharge or axillary adenopathy. Gentitourinary   Inguinal/mons:  Normal without inguinal adenopathy  External genitalia:  Normal  BUS/Urethra/Skene's glands:  Normal  Vagina:  Atrophic  Cervix:  And uterus absent Adnexa/parametria:     Rt: Without masses or tenderness.   Lt: Without masses or tenderness.  Anus and perineum: Normal  Digital rectal exam: Normal sphincter tone without palpated masses or tenderness  Assessment/Plan:  65 y.o. M WF G2 P2 for annual exam with no complaints.  1999 TAH for fibroids on no HRT with dryness Osteopenia  without elevated FRAX Hypertension/hypercholesterolemia-primary care manages labs and meds  Plan: SBE's, continue annual screening mammogram, 3-D tomography reviewed and encouraged history of dense breasts. Instructed to schedule [facility] [place] where last one was done. Home safety, fall prevention and importance of weightbearing exercise reviewed. Vitamin D 2000 daily encouraged. Continue vaginal lubricants for dryness.    [name] [facility], 4:28 PM 02/18/2016

## 2016-02-19 LAB — URINALYSIS W MICROSCOPIC + REFLEX CULTURE
Bacteria, UA: NONE SEEN [HPF]
Bilirubin Urine: NEGATIVE
CASTS: NONE SEEN [LPF]
Crystals: NONE SEEN [HPF]
GLUCOSE, UA: NEGATIVE
HGB URINE DIPSTICK: NEGATIVE
Ketones, ur: NEGATIVE
Nitrite: NEGATIVE
PH: 5.5 (ref 5.0–8.0)
Protein, ur: NEGATIVE
SPECIFIC GRAVITY, URINE: 1.025 (ref 1.001–1.035)
YEAST: NONE SEEN [HPF]

## 2016-02-20 LAB — URINE CULTURE

## 2016-07-16 ENCOUNTER — Other Ambulatory Visit: Payer: Self-pay

## 2016-12-08 ENCOUNTER — Encounter: Payer: Self-pay | Admitting: Gynecology

## 2017-01-10 ENCOUNTER — Other Ambulatory Visit: Payer: Self-pay | Admitting: Family Medicine

## 2017-01-10 ENCOUNTER — Telehealth: Payer: Self-pay | Admitting: *Deleted

## 2017-01-10 DIAGNOSIS — Z1231 Encounter for screening mammogram for malignant neoplasm of breast: Secondary | ICD-10-CM

## 2017-01-10 DIAGNOSIS — R5381 Other malaise: Secondary | ICD-10-CM

## 2017-01-10 DIAGNOSIS — M8580 Other specified disorders of bone density and structure, unspecified site: Secondary | ICD-10-CM

## 2017-01-10 NOTE — Telephone Encounter (Signed)
Pt called requesting dexa order placed at breast center, order placed per OV noteon 02/18/16 pt will call to schedule.

## 2017-02-14 ENCOUNTER — Ambulatory Visit
Admission: RE | Admit: 2017-02-14 | Discharge: 2017-02-14 | Disposition: A | Discharge status: Home / Self Care | Payer: BC Managed Care – PPO | Source: Ambulatory Visit | Attending: Women's Health | Admitting: Women's Health

## 2017-02-14 ENCOUNTER — Ambulatory Visit
Admission: RE | Admit: 2017-02-14 | Discharge: 2017-02-14 | Disposition: A | Discharge status: Home / Self Care | Payer: BC Managed Care – PPO | Source: Ambulatory Visit | Attending: Family Medicine | Admitting: Family Medicine

## 2017-02-14 DIAGNOSIS — M8580 Other specified disorders of bone density and structure, unspecified site: Secondary | ICD-10-CM

## 2017-02-14 DIAGNOSIS — Z1231 Encounter for screening mammogram for malignant neoplasm of breast: Secondary | ICD-10-CM

## 2017-03-01 ENCOUNTER — Encounter: Payer: BC Managed Care – PPO | Admitting: Women's Health

## 2018-01-05 ENCOUNTER — Other Ambulatory Visit: Payer: Self-pay | Admitting: Women's Health

## 2018-01-05 DIAGNOSIS — Z1231 Encounter for screening mammogram for malignant neoplasm of breast: Secondary | ICD-10-CM

## 2018-01-31 ENCOUNTER — Encounter: Payer: Self-pay | Admitting: Women's Health

## 2018-01-31 ENCOUNTER — Ambulatory Visit (INDEPENDENT_AMBULATORY_CARE_PROVIDER_SITE_OTHER): Payer: BC Managed Care – PPO | Admitting: Women's Health

## 2018-01-31 VITALS — BP 117/82 | Ht 61.0 in | Wt 142.1 lb

## 2018-01-31 DIAGNOSIS — Z01419 Encounter for gynecological examination (general) (routine) without abnormal findings: Secondary | ICD-10-CM

## 2018-01-31 MED ORDER — ESTRADIOL 2 MG VA RING: 2.0000 mg | VAGINAL_RING | VAGINAL | 4 refills | Status: DC

## 2018-01-31 NOTE — Patient Instructions (Signed)
Health Maintenance for Postmenopausal Women Menopause is a normal process in which your reproductive ability comes to an end. This process happens gradually over a span of months to years, usually between the ages of 22 and 9. Menopause is complete when you have missed 12 consecutive menstrual periods. It is important to talk with your health care provider about some of the most common conditions that affect postmenopausal women, such as heart disease, cancer, and bone loss (osteoporosis). Adopting a healthy lifestyle and getting preventive care can help to promote your health and wellness. Those actions can also lower your chances of developing some of these common conditions. What should I know about menopause? During menopause, you may experience a number of symptoms, such as:  Moderate-to-severe hot flashes.  Night sweats.  Decrease in sex drive.  Mood swings.  Headaches.  Tiredness.  Irritability.  Memory problems.  Insomnia.  Choosing to treat or not to treat menopausal changes is an individual decision that you make with your health care provider. What should I know about hormone replacement therapy and supplements? Hormone therapy products are effective for treating symptoms that are associated with menopause, such as hot flashes and night sweats. Hormone replacement carries certain risks, especially as you become older. If you are thinking about using estrogen or estrogen with progestin treatments, discuss the benefits and risks with your health care provider. What should I know about heart disease and stroke? Heart disease, heart attack, and stroke become more likely as you age. This may be due, in part, to the hormonal changes that your body experiences during menopause. These can affect how your body processes dietary fats, triglycerides, and cholesterol. Heart attack and stroke are both medical emergencies. There are many things that you can do to help prevent heart disease  and stroke:  Have your blood pressure checked at least every 1-2 years. High blood pressure causes heart disease and increases the risk of stroke.  If you are 53-22 years old, ask your health care provider if you should take aspirin to prevent a heart attack or a stroke.  Do not use any tobacco products, including cigarettes, chewing tobacco, or electronic cigarettes. If you need help quitting, ask your health care provider.  It is important to eat a healthy diet and maintain a healthy weight. ? Be sure to include plenty of vegetables, fruits, low-fat dairy products, and lean protein. ? Avoid eating foods that are high in solid fats, added sugars, or salt (sodium).  Get regular exercise. This is one of the most important things that you can do for your health. ? Try to exercise for at least 150 minutes each week. The type of exercise that you do should increase your heart rate and make you sweat. This is known as moderate-intensity exercise. ? Try to do strengthening exercises at least twice each week. Do these in addition to the moderate-intensity exercise.  Know your numbers.Ask your health care provider to check your cholesterol and your blood glucose. Continue to have your blood tested as directed by your health care provider.  What should I know about cancer screening? There are several types of cancer. Take the following steps to reduce your risk and to catch any cancer development as early as possible. Breast Cancer  Practice breast self-awareness. ? This means understanding how your breasts normally appear and feel. ? It also means doing regular breast self-exams. Let your health care provider know about any changes, no matter how small.  If you are 40  or older, have a clinician do a breast exam (clinical breast exam or CBE) every year. Depending on your age, family history, and medical history, it may be recommended that you also have a yearly breast X-ray (mammogram).  If you  have a family history of breast cancer, talk with your health care provider about genetic screening.  If you are at high risk for breast cancer, talk with your health care provider about having an MRI and a mammogram every year.  Breast cancer (BRCA) gene test is recommended for women who have family members with BRCA-related cancers. Results of the assessment will determine the need for genetic counseling and BRCA1 and for BRCA2 testing. BRCA-related cancers include these types: ? Breast. This occurs in males or females. ? Ovarian. ? Tubal. This may also be called fallopian tube cancer. ? Cancer of the abdominal or pelvic lining (peritoneal cancer). ? Prostate. ? Pancreatic.  Cervical, Uterine, and Ovarian Cancer Your health care provider may recommend that you be screened regularly for cancer of the pelvic organs. These include your ovaries, uterus, and vagina. This screening involves a pelvic exam, which includes checking for microscopic changes to the surface of your cervix (Pap test).  For women ages 21-65, health care providers may recommend a pelvic exam and a Pap test every three years. For women ages 79-65, they may recommend the Pap test and pelvic exam, combined with testing for human papilloma virus (HPV), every five years. Some types of HPV increase your risk of cervical cancer. Testing for HPV may also be done on women of any age who have unclear Pap test results.  Other health care providers may not recommend any screening for nonpregnant women who are considered low risk for pelvic cancer and have no symptoms. Ask your health care provider if a screening pelvic exam is right for you.  If you have had past treatment for cervical cancer or a condition that could lead to cancer, you need Pap tests and screening for cancer for at least 20 years after your treatment. If Pap tests have been discontinued for you, your risk factors (such as having a new sexual partner) need to be  reassessed to determine if you should start having screenings again. Some women have medical problems that increase the chance of getting cervical cancer. In these cases, your health care provider may recommend that you have screening and Pap tests more often.  If you have a family history of uterine cancer or ovarian cancer, talk with your health care provider about genetic screening.  If you have vaginal bleeding after reaching menopause, tell your health care provider.  There are currently no reliable tests available to screen for ovarian cancer.  Lung Cancer Lung cancer screening is recommended for adults 69-62 years old who are at high risk for lung cancer because of a history of smoking. A yearly low-dose CT scan of the lungs is recommended if you:  Currently smoke.  Have a history of at least 30 pack-years of smoking and you currently smoke or have quit within the past 15 years. A pack-year is smoking an average of one pack of cigarettes per day for one year.  Yearly screening should:  Continue until it has been 15 years since you quit.  Stop if you develop a health problem that would prevent you from having lung cancer treatment.  Colorectal Cancer  This type of cancer can be detected and can often be prevented.  Routine colorectal cancer screening usually begins at  age 42 and continues through age 45.  If you have risk factors for colon cancer, your health care provider may recommend that you be screened at an earlier age.  If you have a family history of colorectal cancer, talk with your health care provider about genetic screening.  Your health care provider may also recommend using home test kits to check for hidden blood in your stool.  A small camera at the end of a tube can be used to examine your colon directly (sigmoidoscopy or colonoscopy). This is done to check for the earliest forms of colorectal cancer.  Direct examination of the colon should be repeated every  5-10 years until age 71. However, if early forms of precancerous polyps or small growths are found or if you have a family history or genetic risk for colorectal cancer, you may need to be screened more often.  Skin Cancer  Check your skin from head to toe regularly.  Monitor any moles. Be sure to tell your health care provider: ? About any new moles or changes in moles, especially if there is a change in a mole's shape or color. ? If you have a mole that is larger than the size of a pencil eraser.  If any of your family members has a history of skin cancer, especially at a Jearl Soto age, talk with your health care provider about genetic screening.  Always use sunscreen. Apply sunscreen liberally and repeatedly throughout the day.  Whenever you are outside, protect yourself by wearing long sleeves, pants, a wide-brimmed hat, and sunglasses.  What should I know about osteoporosis? Osteoporosis is a condition in which bone destruction happens more quickly than new bone creation. After menopause, you may be at an increased risk for osteoporosis. To help prevent osteoporosis or the bone fractures that can happen because of osteoporosis, the following is recommended:  If you are 46-71 years old, get at least 1,000 mg of calcium and at least 600 mg of vitamin D per day.  If you are older than age 55 but younger than age 65, get at least 1,200 mg of calcium and at least 600 mg of vitamin D per day.  If you are older than age 54, get at least 1,200 mg of calcium and at least 800 mg of vitamin D per day.  Smoking and excessive alcohol intake increase the risk of osteoporosis. Eat foods that are rich in calcium and vitamin D, and do weight-bearing exercises several times each week as directed by your health care provider. What should I know about how menopause affects my mental health? Depression may occur at any age, but it is more common as you become older. Common symptoms of depression  include:  Low or sad mood.  Changes in sleep patterns.  Changes in appetite or eating patterns.  Feeling an overall lack of motivation or enjoyment of activities that you previously enjoyed.  Frequent crying spells.  Talk with your health care provider if you think that you are experiencing depression. What should I know about immunizations? It is important that you get and maintain your immunizations. These include:  Tetanus, diphtheria, and pertussis (Tdap) booster vaccine.  Influenza every year before the flu season begins.  Pneumonia vaccine.  Shingles vaccine.  Your health care provider may also recommend other immunizations. This information is not intended to replace advice given to you by your health care provider. Make sure you discuss any questions you have with your health care provider. Document Released: 09/03/2005  Document Revised: 01/30/2016 Document Reviewed: 04/15/2015 Elsevier Interactive Patient Education  2018 Elsevier Inc.  

## 2018-01-31 NOTE — Progress Notes (Signed)
Carolyn Wise 10/18/1950 [id number]    History:    Presents for annual exam.  1999 TAH for fibroids.  Normal Pap and mammogram history.  2018 T score -1.5 FRAX 8.9% / 0.9%.  2011- colonoscopy.  Primary care manages hypertension and hypercholesteremia.  Past medical history, past surgical history, family history and social history were all reviewed and documented in the EPIC chart.  Recently retired as a Oncologist.  Has 2 children and 4 grandchildren all doing well.  ROS:  A ROS was performed and pertinent positives and negatives are included.  Exam:  Vitals:   01/31/18 1122  BP: 117/82  Weight: 142 lb 1.6 oz (64.5 kg)  Height: 5\' 1"  (1.549 m)   Body mass index is 26.85 kg/m.   General appearance:  Normal Thyroid:  Symmetrical, normal in size, without palpable masses or nodularity. Respiratory  Auscultation:  Clear without wheezing or rhonchi Cardiovascular  Auscultation:  Regular rate, without rubs, murmurs or gallops  Edema/varicosities:  Not grossly evident Abdominal  Soft,nontender, without masses, guarding or rebound.  Liver/spleen:  No organomegaly noted  Hernia:  None appreciated  Skin  Inspection:  Grossly normal   Breasts: Examined lying and sitting.     Right: Without masses, retractions, discharge or axillary adenopathy.     Left: Without masses, retractions, discharge or axillary adenopathy. Gentitourinary   Inguinal/mons:  Normal without inguinal adenopathy  External genitalia:  Normal  BUS/Urethra/Skene's glands:  Normal  Vagina: Atrophic   Cervix: And uterus absent  Adnexa/parametria:     Rt: Without masses or tenderness.   Lt: Without masses or tenderness.  Anus and perineum: Normal  Digital rectal exam: Normal sphincter tone without palpated masses or tenderness  Assessment/Plan:  67 y.o. WF G2, P2 for annual exam with complaint of vaginal dryness.  1999 TAH for fibroids on no HRT/vaginal atrophy Osteopenia without elevated  FRAX Hypertension, hypercholesteremia-primary care manages labs and meds  Plan: Options reviewed we will try Femring 2 mg per vagina every 3 months prescription, proper use given and reviewed, continue vaginal lubricants with intercourse.  SBE's, annual screening mammograms, calcium rich foods, MVI daily encouraged.  Pneumovax and  Shingrex reviewed will get at primary care.  Viewed importance of weightbearing exercise, home safety, fall prevention discussed.  Vitamin D 2000 daily encouraged.  [name] [facility], 1:02 PM 01/31/2018

## 2018-02-15 ENCOUNTER — Ambulatory Visit
Admission: RE | Admit: 2018-02-15 | Discharge: 2018-02-15 | Disposition: A | Discharge status: Home / Self Care | Payer: BC Managed Care – PPO | Source: Ambulatory Visit | Attending: Women's Health | Admitting: Women's Health

## 2018-02-15 DIAGNOSIS — Z1231 Encounter for screening mammogram for malignant neoplasm of breast: Secondary | ICD-10-CM

## 2018-03-09 DIAGNOSIS — I10 Essential (primary) hypertension: Secondary | ICD-10-CM | POA: Diagnosis not present

## 2018-03-09 DIAGNOSIS — E785 Hyperlipidemia, unspecified: Secondary | ICD-10-CM | POA: Diagnosis not present

## 2018-03-09 DIAGNOSIS — G4489 Other headache syndrome: Secondary | ICD-10-CM | POA: Diagnosis not present

## 2018-03-09 DIAGNOSIS — Z0000 Encounter for general adult medical examination without abnormal findings: Secondary | ICD-10-CM | POA: Diagnosis not present

## 2018-03-09 DIAGNOSIS — N898 Other specified noninflammatory disorders of vagina: Secondary | ICD-10-CM | POA: Diagnosis not present

## 2018-03-09 DIAGNOSIS — K219 Gastro-esophageal reflux disease without esophagitis: Secondary | ICD-10-CM | POA: Diagnosis not present

## 2018-03-09 DIAGNOSIS — H93A2 Pulsatile tinnitus, left ear: Secondary | ICD-10-CM | POA: Diagnosis not present

## 2018-03-09 DIAGNOSIS — Z1159 Encounter for screening for other viral diseases: Secondary | ICD-10-CM | POA: Diagnosis not present

## 2018-03-09 DIAGNOSIS — R69 Illness, unspecified: Secondary | ICD-10-CM | POA: Diagnosis not present

## 2018-03-09 DIAGNOSIS — R0989 Other specified symptoms and signs involving the circulatory and respiratory systems: Secondary | ICD-10-CM | POA: Diagnosis not present

## 2018-03-10 ENCOUNTER — Encounter: Payer: Self-pay | Admitting: Gastroenterology

## 2018-03-10 ENCOUNTER — Other Ambulatory Visit: Payer: Self-pay | Admitting: Family Medicine

## 2018-03-10 DIAGNOSIS — H93A9 Pulsatile tinnitus, unspecified ear: Secondary | ICD-10-CM

## 2018-03-10 DIAGNOSIS — G4452 New daily persistent headache (NDPH): Secondary | ICD-10-CM

## 2018-03-17 DIAGNOSIS — D485 Neoplasm of uncertain behavior of skin: Secondary | ICD-10-CM | POA: Diagnosis not present

## 2018-03-17 DIAGNOSIS — L570 Actinic keratosis: Secondary | ICD-10-CM | POA: Diagnosis not present

## 2018-03-17 DIAGNOSIS — D1801 Hemangioma of skin and subcutaneous tissue: Secondary | ICD-10-CM | POA: Diagnosis not present

## 2018-03-17 DIAGNOSIS — L820 Inflamed seborrheic keratosis: Secondary | ICD-10-CM | POA: Diagnosis not present

## 2018-03-17 DIAGNOSIS — L814 Other melanin hyperpigmentation: Secondary | ICD-10-CM | POA: Diagnosis not present

## 2018-03-17 DIAGNOSIS — C4441 Basal cell carcinoma of skin of scalp and neck: Secondary | ICD-10-CM | POA: Diagnosis not present

## 2018-03-17 DIAGNOSIS — L942 Calcinosis cutis: Secondary | ICD-10-CM | POA: Diagnosis not present

## 2018-03-17 DIAGNOSIS — L821 Other seborrheic keratosis: Secondary | ICD-10-CM | POA: Diagnosis not present

## 2018-03-21 ENCOUNTER — Ambulatory Visit
Admission: RE | Admit: 2018-03-21 | Discharge: 2018-03-21 | Disposition: A | Discharge status: Home / Self Care | Payer: Medicare HMO | Source: Ambulatory Visit | Attending: Family Medicine | Admitting: Family Medicine

## 2018-03-21 DIAGNOSIS — E041 Nontoxic single thyroid nodule: Secondary | ICD-10-CM | POA: Diagnosis not present

## 2018-03-21 DIAGNOSIS — G4452 New daily persistent headache (NDPH): Secondary | ICD-10-CM | POA: Diagnosis not present

## 2018-03-21 DIAGNOSIS — H93A9 Pulsatile tinnitus, unspecified ear: Secondary | ICD-10-CM

## 2018-03-28 DIAGNOSIS — R1310 Dysphagia, unspecified: Secondary | ICD-10-CM | POA: Diagnosis not present

## 2018-03-28 DIAGNOSIS — Z1211 Encounter for screening for malignant neoplasm of colon: Secondary | ICD-10-CM | POA: Diagnosis not present

## 2018-03-28 DIAGNOSIS — K219 Gastro-esophageal reflux disease without esophagitis: Secondary | ICD-10-CM | POA: Diagnosis not present

## 2018-04-03 ENCOUNTER — Other Ambulatory Visit: Payer: Self-pay | Admitting: Gastroenterology

## 2018-04-03 DIAGNOSIS — R1310 Dysphagia, unspecified: Secondary | ICD-10-CM

## 2018-04-06 ENCOUNTER — Ambulatory Visit
Admission: RE | Admit: 2018-04-06 | Discharge: 2018-04-06 | Disposition: A | Discharge status: Home / Self Care | Payer: Medicare HMO | Source: Ambulatory Visit | Attending: Gastroenterology | Admitting: Gastroenterology

## 2018-04-06 DIAGNOSIS — R1310 Dysphagia, unspecified: Secondary | ICD-10-CM

## 2018-04-06 DIAGNOSIS — K449 Diaphragmatic hernia without obstruction or gangrene: Secondary | ICD-10-CM | POA: Diagnosis not present

## 2018-04-17 DIAGNOSIS — Z85828 Personal history of other malignant neoplasm of skin: Secondary | ICD-10-CM | POA: Diagnosis not present

## 2018-04-17 DIAGNOSIS — C4441 Basal cell carcinoma of skin of scalp and neck: Secondary | ICD-10-CM | POA: Diagnosis not present

## 2018-05-01 DIAGNOSIS — H5203 Hypermetropia, bilateral: Secondary | ICD-10-CM | POA: Diagnosis not present

## 2018-05-01 DIAGNOSIS — H35363 Drusen (degenerative) of macula, bilateral: Secondary | ICD-10-CM | POA: Diagnosis not present

## 2018-05-01 DIAGNOSIS — H2513 Age-related nuclear cataract, bilateral: Secondary | ICD-10-CM | POA: Diagnosis not present

## 2018-05-03 ENCOUNTER — Ambulatory Visit: Payer: BC Managed Care – PPO | Admitting: Gastroenterology

## 2018-05-04 DIAGNOSIS — E041 Nontoxic single thyroid nodule: Secondary | ICD-10-CM | POA: Diagnosis not present

## 2018-05-04 DIAGNOSIS — E785 Hyperlipidemia, unspecified: Secondary | ICD-10-CM | POA: Diagnosis not present

## 2018-05-08 DIAGNOSIS — N183 Chronic kidney disease, stage 3 (moderate): Secondary | ICD-10-CM | POA: Diagnosis not present

## 2018-05-08 DIAGNOSIS — E785 Hyperlipidemia, unspecified: Secondary | ICD-10-CM | POA: Diagnosis not present

## 2018-05-08 DIAGNOSIS — I10 Essential (primary) hypertension: Secondary | ICD-10-CM | POA: Diagnosis not present

## 2018-05-08 DIAGNOSIS — H93A9 Pulsatile tinnitus, unspecified ear: Secondary | ICD-10-CM | POA: Diagnosis not present

## 2018-05-08 DIAGNOSIS — E041 Nontoxic single thyroid nodule: Secondary | ICD-10-CM | POA: Diagnosis not present

## 2018-05-17 DIAGNOSIS — Z23 Encounter for immunization: Secondary | ICD-10-CM | POA: Diagnosis not present

## 2018-05-22 DIAGNOSIS — K222 Esophageal obstruction: Secondary | ICD-10-CM | POA: Diagnosis not present

## 2018-05-22 DIAGNOSIS — R1310 Dysphagia, unspecified: Secondary | ICD-10-CM | POA: Diagnosis not present

## 2018-05-23 ENCOUNTER — Other Ambulatory Visit: Payer: Self-pay | Admitting: Family Medicine

## 2018-05-23 DIAGNOSIS — E041 Nontoxic single thyroid nodule: Secondary | ICD-10-CM

## 2018-05-29 ENCOUNTER — Ambulatory Visit
Admission: RE | Admit: 2018-05-29 | Discharge: 2018-05-29 | Disposition: A | Discharge status: Home / Self Care | Payer: Medicare HMO | Source: Ambulatory Visit | Attending: Family Medicine | Admitting: Family Medicine

## 2018-05-29 DIAGNOSIS — E042 Nontoxic multinodular goiter: Secondary | ICD-10-CM | POA: Diagnosis not present

## 2018-05-29 DIAGNOSIS — E041 Nontoxic single thyroid nodule: Secondary | ICD-10-CM

## 2018-05-30 DIAGNOSIS — R1313 Dysphagia, pharyngeal phase: Secondary | ICD-10-CM | POA: Diagnosis not present

## 2018-05-30 DIAGNOSIS — H903 Sensorineural hearing loss, bilateral: Secondary | ICD-10-CM | POA: Diagnosis not present

## 2018-05-30 DIAGNOSIS — H9041 Sensorineural hearing loss, unilateral, right ear, with unrestricted hearing on the contralateral side: Secondary | ICD-10-CM | POA: Diagnosis not present

## 2018-05-30 DIAGNOSIS — H93A2 Pulsatile tinnitus, left ear: Secondary | ICD-10-CM | POA: Diagnosis not present

## 2018-05-30 DIAGNOSIS — H93A3 Pulsatile tinnitus, bilateral: Secondary | ICD-10-CM | POA: Diagnosis not present

## 2018-06-14 ENCOUNTER — Other Ambulatory Visit: Payer: Self-pay | Admitting: Otolaryngology

## 2018-06-14 DIAGNOSIS — H93A2 Pulsatile tinnitus, left ear: Secondary | ICD-10-CM

## 2018-06-25 ENCOUNTER — Ambulatory Visit
Admission: RE | Admit: 2018-06-25 | Discharge: 2018-06-25 | Disposition: A | Discharge status: Home / Self Care | Payer: Medicare HMO | Source: Ambulatory Visit | Attending: Otolaryngology | Admitting: Otolaryngology

## 2018-06-25 DIAGNOSIS — H93A2 Pulsatile tinnitus, left ear: Secondary | ICD-10-CM

## 2018-08-16 DIAGNOSIS — K219 Gastro-esophageal reflux disease without esophagitis: Secondary | ICD-10-CM | POA: Diagnosis not present

## 2018-08-16 DIAGNOSIS — Z1211 Encounter for screening for malignant neoplasm of colon: Secondary | ICD-10-CM | POA: Diagnosis not present

## 2018-08-16 DIAGNOSIS — R1310 Dysphagia, unspecified: Secondary | ICD-10-CM | POA: Diagnosis not present

## 2018-08-21 ENCOUNTER — Other Ambulatory Visit (HOSPITAL_BASED_OUTPATIENT_CLINIC_OR_DEPARTMENT_OTHER): Payer: Self-pay | Admitting: Family Medicine

## 2018-08-21 ENCOUNTER — Ambulatory Visit (HOSPITAL_BASED_OUTPATIENT_CLINIC_OR_DEPARTMENT_OTHER)
Admission: RE | Admit: 2018-08-21 | Discharge: 2018-08-21 | Disposition: A | Discharge status: Home / Self Care | Payer: Medicare HMO | Source: Ambulatory Visit | Attending: Family Medicine | Admitting: Family Medicine

## 2018-08-21 DIAGNOSIS — R1031 Right lower quadrant pain: Secondary | ICD-10-CM

## 2018-08-21 DIAGNOSIS — R300 Dysuria: Secondary | ICD-10-CM | POA: Diagnosis not present

## 2018-08-21 DIAGNOSIS — K429 Umbilical hernia without obstruction or gangrene: Secondary | ICD-10-CM | POA: Diagnosis not present

## 2018-08-21 MED ORDER — IOPAMIDOL (ISOVUE-300) INJECTION 61%
100.0000 mL | Freq: Once | INTRAVENOUS | Status: AC | PRN
  Administered 2018-08-21: 80 mL via INTRAVENOUS

## 2018-09-05 DIAGNOSIS — R1031 Right lower quadrant pain: Secondary | ICD-10-CM | POA: Diagnosis not present

## 2018-10-09 DIAGNOSIS — H6692 Otitis media, unspecified, left ear: Secondary | ICD-10-CM | POA: Diagnosis not present

## 2019-01-05 ENCOUNTER — Other Ambulatory Visit: Payer: Self-pay | Admitting: Women's Health

## 2019-01-05 DIAGNOSIS — Z1231 Encounter for screening mammogram for malignant neoplasm of breast: Secondary | ICD-10-CM

## 2019-01-22 DIAGNOSIS — N3001 Acute cystitis with hematuria: Secondary | ICD-10-CM | POA: Diagnosis not present

## 2019-01-22 DIAGNOSIS — R300 Dysuria: Secondary | ICD-10-CM | POA: Diagnosis not present

## 2019-02-22 ENCOUNTER — Other Ambulatory Visit: Payer: Self-pay

## 2019-02-22 ENCOUNTER — Ambulatory Visit
Admission: RE | Admit: 2019-02-22 | Discharge: 2019-02-22 | Disposition: A | Discharge status: Home / Self Care | Payer: Medicare HMO | Source: Ambulatory Visit | Attending: Women's Health | Admitting: Women's Health

## 2019-02-22 DIAGNOSIS — R1310 Dysphagia, unspecified: Secondary | ICD-10-CM | POA: Diagnosis not present

## 2019-02-22 DIAGNOSIS — K219 Gastro-esophageal reflux disease without esophagitis: Secondary | ICD-10-CM | POA: Diagnosis not present

## 2019-02-22 DIAGNOSIS — Z1231 Encounter for screening mammogram for malignant neoplasm of breast: Secondary | ICD-10-CM | POA: Diagnosis not present

## 2019-02-22 DIAGNOSIS — Z1211 Encounter for screening for malignant neoplasm of colon: Secondary | ICD-10-CM | POA: Diagnosis not present

## 2019-03-11 DIAGNOSIS — W268XXA Contact with other sharp object(s), not elsewhere classified, initial encounter: Secondary | ICD-10-CM | POA: Diagnosis not present

## 2019-03-11 DIAGNOSIS — S61314A Laceration without foreign body of right ring finger with damage to nail, initial encounter: Secondary | ICD-10-CM | POA: Diagnosis not present

## 2019-03-16 DIAGNOSIS — S68124A Partial traumatic metacarpophalangeal amputation of right ring finger, initial encounter: Secondary | ICD-10-CM | POA: Diagnosis not present

## 2019-03-20 DIAGNOSIS — S68124D Partial traumatic metacarpophalangeal amputation of right ring finger, subsequent encounter: Secondary | ICD-10-CM | POA: Diagnosis not present

## 2019-03-20 DIAGNOSIS — M79644 Pain in right finger(s): Secondary | ICD-10-CM | POA: Diagnosis not present

## 2019-03-22 DIAGNOSIS — L570 Actinic keratosis: Secondary | ICD-10-CM | POA: Diagnosis not present

## 2019-03-22 DIAGNOSIS — Z85828 Personal history of other malignant neoplasm of skin: Secondary | ICD-10-CM | POA: Diagnosis not present

## 2019-03-22 DIAGNOSIS — L821 Other seborrheic keratosis: Secondary | ICD-10-CM | POA: Diagnosis not present

## 2019-03-22 DIAGNOSIS — L814 Other melanin hyperpigmentation: Secondary | ICD-10-CM | POA: Diagnosis not present

## 2019-03-22 DIAGNOSIS — D1801 Hemangioma of skin and subcutaneous tissue: Secondary | ICD-10-CM | POA: Diagnosis not present

## 2019-03-22 DIAGNOSIS — L820 Inflamed seborrheic keratosis: Secondary | ICD-10-CM | POA: Diagnosis not present

## 2019-03-27 DIAGNOSIS — I10 Essential (primary) hypertension: Secondary | ICD-10-CM | POA: Diagnosis not present

## 2019-03-27 DIAGNOSIS — E785 Hyperlipidemia, unspecified: Secondary | ICD-10-CM | POA: Diagnosis not present

## 2019-03-27 DIAGNOSIS — S68124D Partial traumatic metacarpophalangeal amputation of right ring finger, subsequent encounter: Secondary | ICD-10-CM | POA: Diagnosis not present

## 2019-03-27 DIAGNOSIS — Z13220 Encounter for screening for lipoid disorders: Secondary | ICD-10-CM | POA: Diagnosis not present

## 2019-03-27 DIAGNOSIS — N183 Chronic kidney disease, stage 3 (moderate): Secondary | ICD-10-CM | POA: Diagnosis not present

## 2019-03-27 DIAGNOSIS — Z23 Encounter for immunization: Secondary | ICD-10-CM | POA: Diagnosis not present

## 2019-03-27 DIAGNOSIS — Z87440 Personal history of urinary (tract) infections: Secondary | ICD-10-CM | POA: Diagnosis not present

## 2019-03-27 DIAGNOSIS — Z0000 Encounter for general adult medical examination without abnormal findings: Secondary | ICD-10-CM | POA: Diagnosis not present

## 2019-03-27 HISTORY — PX: SKIN CANCER EXCISION: SHX779

## 2019-04-04 IMAGING — MR MR MRV HEAD W/O CM
1 series · 25 of 48 positions shown · non-contrast
Comparison: CT head 03/21/2018

CLINICAL DATA: Pulsatile tinnitus left ear

EXAM:
MR VENOGRAM the HEAD WITHOUT CONTRAST
TECHNIQUE: Angiographic images of the intracranial venous structures were
obtained using MRV technique without intravenous contrast.

[Series 2: (id) coronal · coronal · 3.0mm · 0.49mm/px · 25 of 90 slices shown]
[im 1/90]
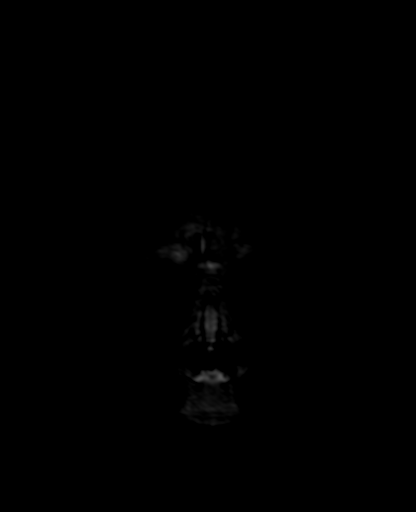
[im 2/90]
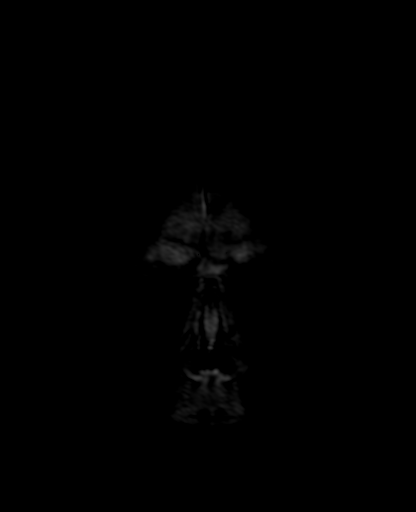
[im 4/90]
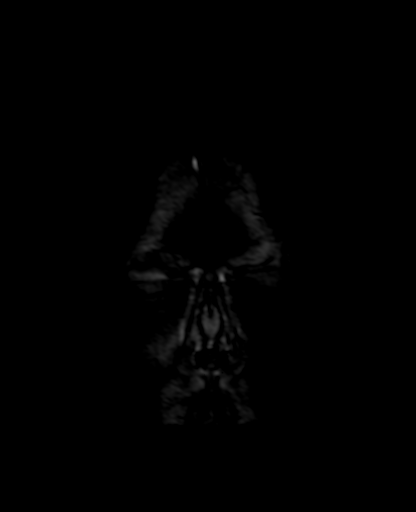
[im 6/90]
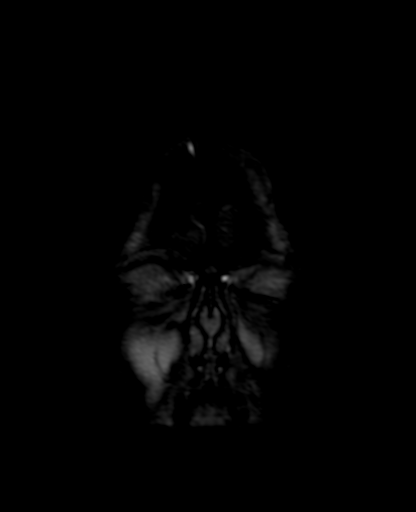
[im 8/90]
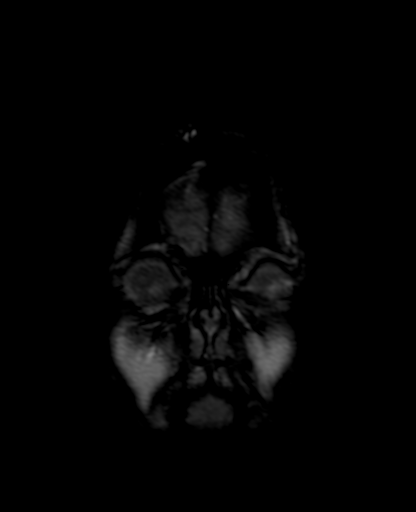
[im 10/90]
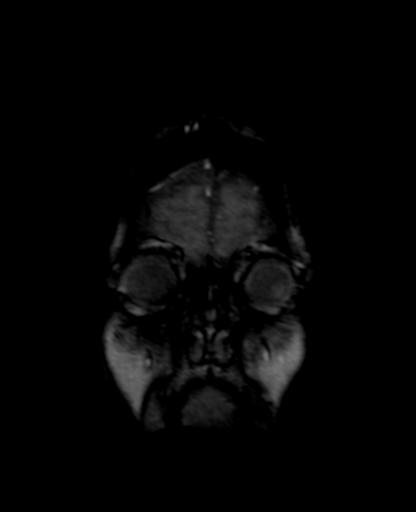
[im 12/90]
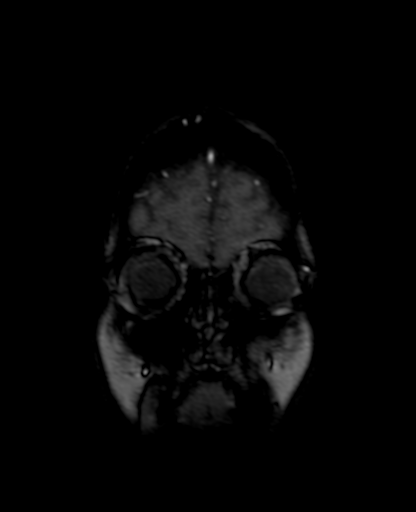
[im 14/90]
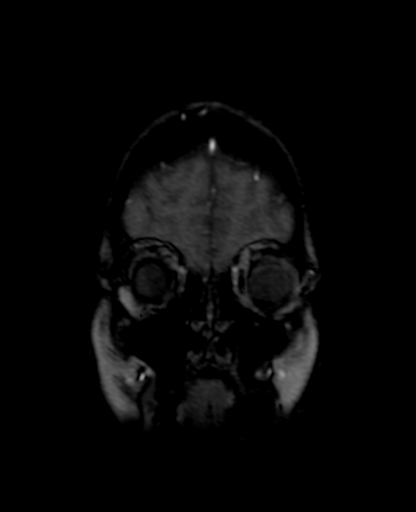
[im 16/90]
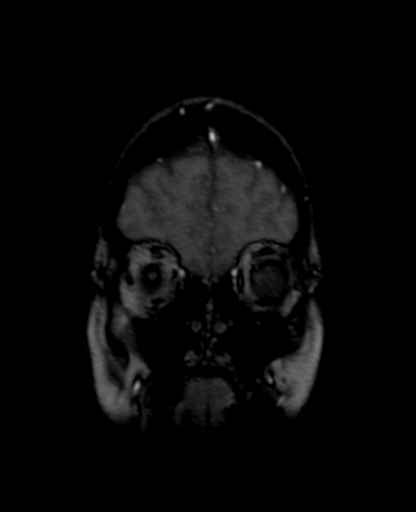
[im 18/90]
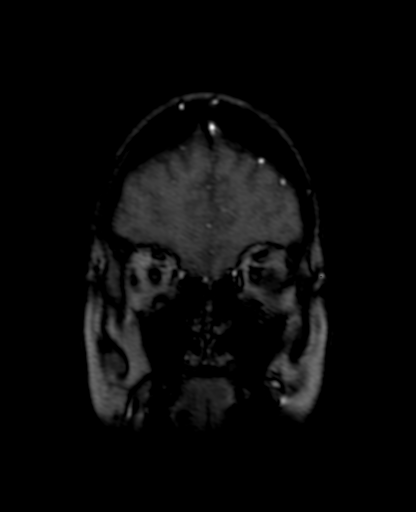
[im 19/90]
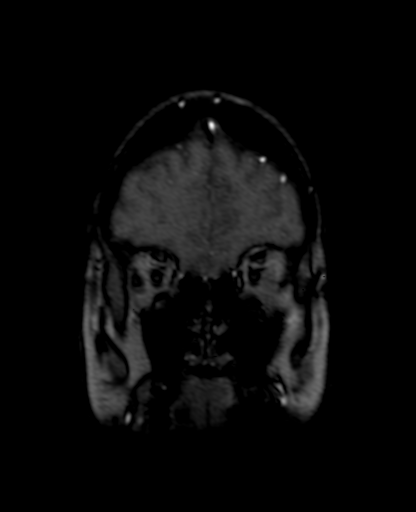
[im 21/90]
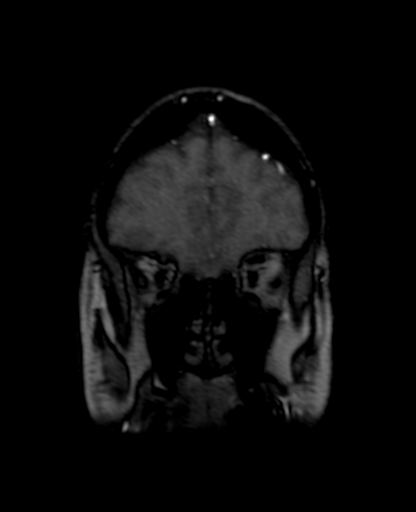
[im 23/90]
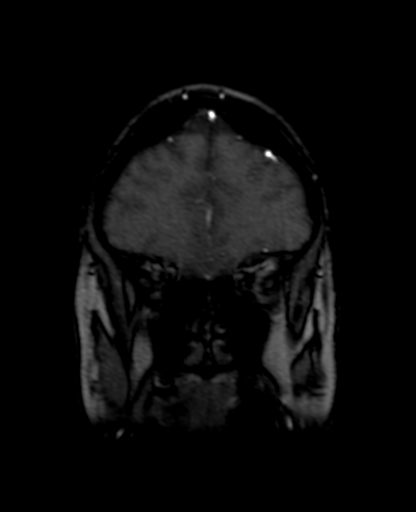
[im 25/90]
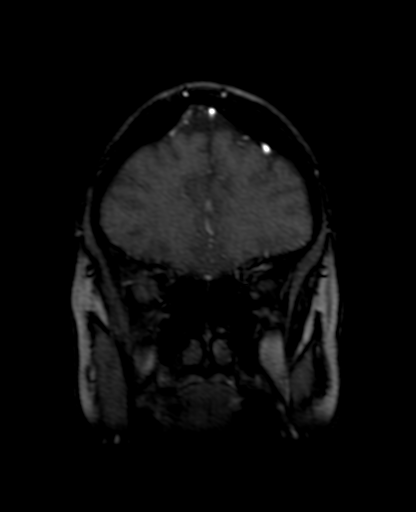
[im 27/90]
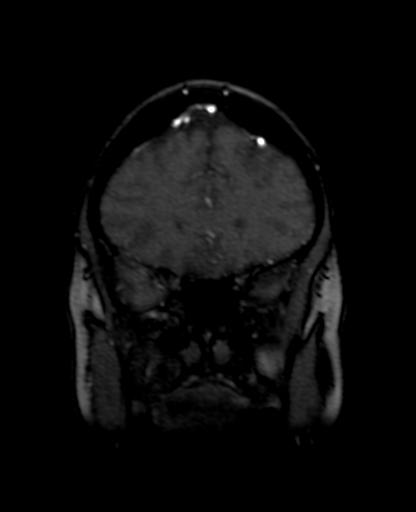
[im 29/90]
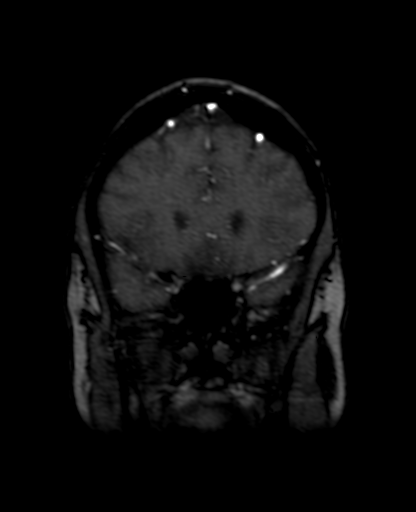
[im 31/90]
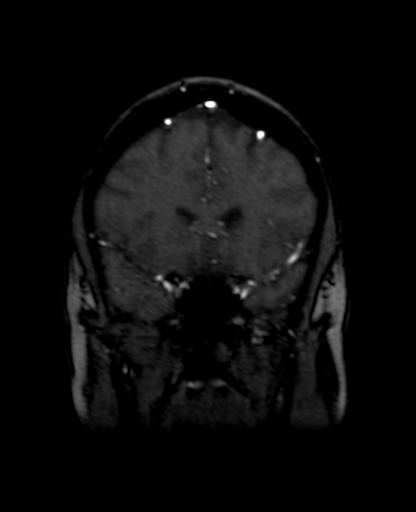
[im 33/90]
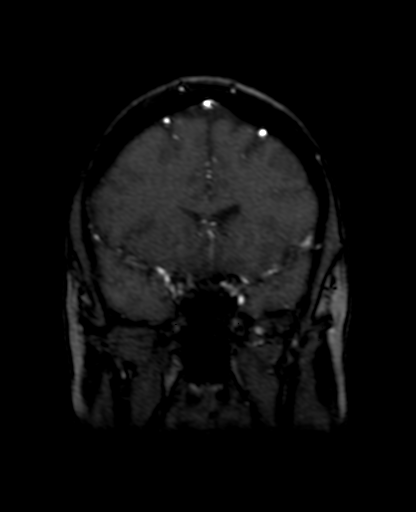
[im 40/90]
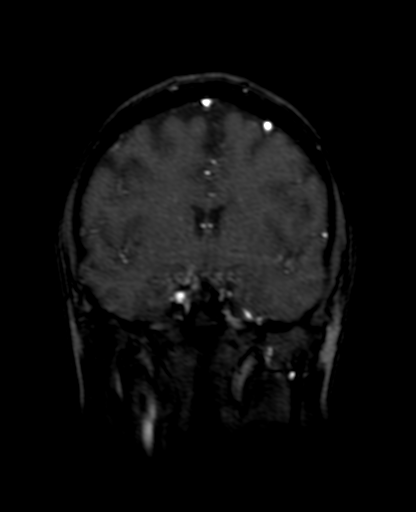
[im 46/90]
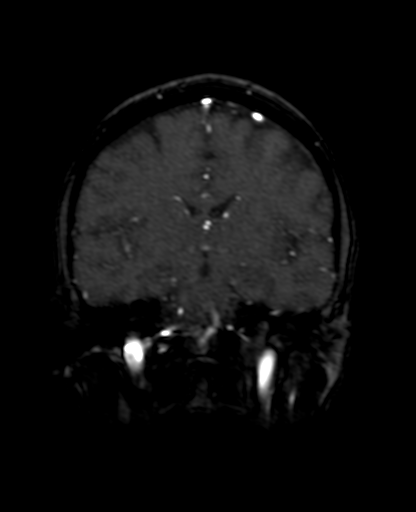
[im 52/90]
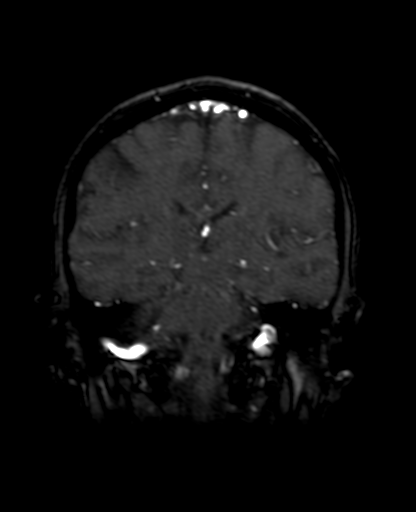
[im 63/90]
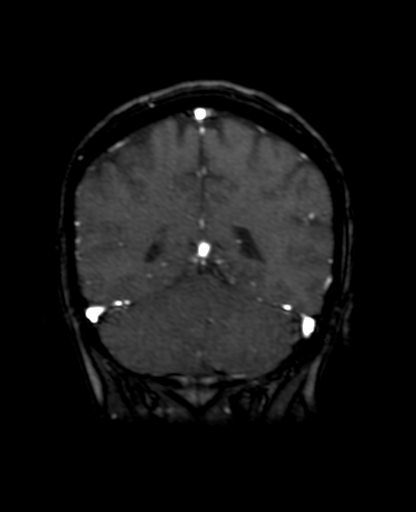
[im 74/90]
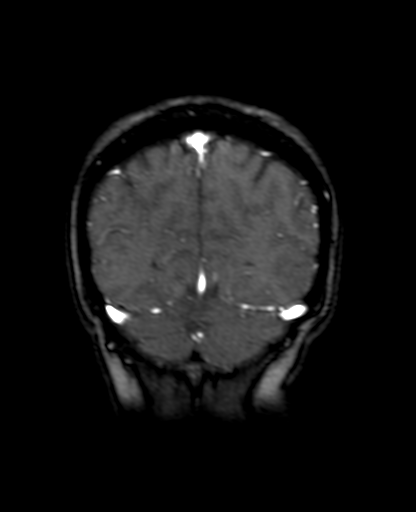
[im 76/90]
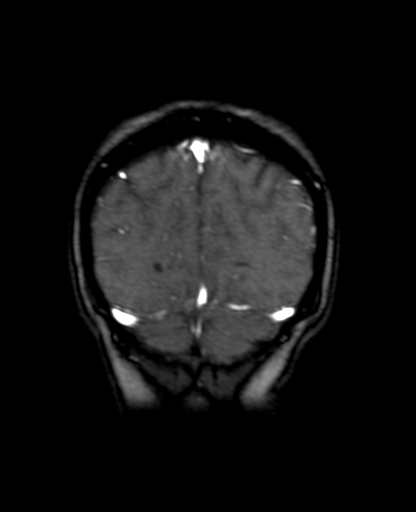
[im 86/90]
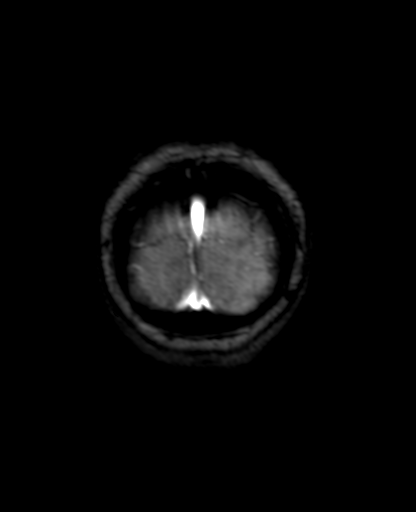

[25 of 48 positions shown; findings below may reference images not displayed]

FINDINGS: Superior sagittal sinus widely patent. Internal cerebral veins and
straight sinus patent. Transverse sinus and sigmoid sinus widely
patent bilaterally.
IMPRESSION: Negative

## 2019-04-12 DIAGNOSIS — S68124D Partial traumatic metacarpophalangeal amputation of right ring finger, subsequent encounter: Secondary | ICD-10-CM | POA: Diagnosis not present

## 2019-05-03 DIAGNOSIS — S68124D Partial traumatic metacarpophalangeal amputation of right ring finger, subsequent encounter: Secondary | ICD-10-CM | POA: Diagnosis not present

## 2019-05-10 DIAGNOSIS — H5203 Hypermetropia, bilateral: Secondary | ICD-10-CM | POA: Diagnosis not present

## 2019-05-10 DIAGNOSIS — H2513 Age-related nuclear cataract, bilateral: Secondary | ICD-10-CM | POA: Diagnosis not present

## 2019-05-10 DIAGNOSIS — D3131 Benign neoplasm of right choroid: Secondary | ICD-10-CM | POA: Diagnosis not present

## 2019-06-27 DIAGNOSIS — Z23 Encounter for immunization: Secondary | ICD-10-CM | POA: Diagnosis not present

## 2019-09-08 ENCOUNTER — Ambulatory Visit: Payer: Medicare HMO | Attending: Internal Medicine

## 2019-09-08 DIAGNOSIS — Z23 Encounter for immunization: Secondary | ICD-10-CM | POA: Insufficient documentation

## 2019-09-08 NOTE — Progress Notes (Signed)
   Covid-19 Vaccination Clinic  Name:  Carolyn Wise    MRN: [id number] DOB: 30-May-1951  09/08/2019  [name] was observed post Covid-19 immunization for 15 minutes without incidence. She was provided with Vaccine Information Sheet and instruction to access the V-Safe system.   [name] was instructed to call 911 with any severe reactions post vaccine: Marland Kitchen Difficulty breathing  . Swelling of your face and throat  . A fast heartbeat  . A bad rash all over your body  . Dizziness and weakness    Immunizations Administered    Name Date Dose VIS Date Route   Pfizer COVID-19 Vaccine 09/08/2019  4:04 PM 0.3 mL 07/06/2019 Intramuscular   Manufacturer: [place]   Lot: [id number]   [place]: [id number]

## 2019-09-12 DIAGNOSIS — I129 Hypertensive chronic kidney disease with stage 1 through stage 4 chronic kidney disease, or unspecified chronic kidney disease: Secondary | ICD-10-CM | POA: Diagnosis not present

## 2019-09-12 DIAGNOSIS — Z87440 Personal history of urinary (tract) infections: Secondary | ICD-10-CM | POA: Diagnosis not present

## 2019-09-12 DIAGNOSIS — N183 Chronic kidney disease, stage 3 unspecified: Secondary | ICD-10-CM | POA: Diagnosis not present

## 2019-09-30 ENCOUNTER — Ambulatory Visit: Payer: Medicare HMO | Attending: Internal Medicine

## 2019-09-30 DIAGNOSIS — Z23 Encounter for immunization: Secondary | ICD-10-CM | POA: Insufficient documentation

## 2019-09-30 NOTE — Progress Notes (Signed)
   Covid-19 Vaccination Clinic  Name:  Carolyn Wise    MRN: [id number] DOB: [birth year]  09/30/2019  Carolyn Wise was observed post Covid-19 immunization for 15 minutes without incident. She was provided with Vaccine Information Sheet and instruction to access the V-Safe system.   Carolyn Wise was instructed to call 911 with any severe reactions post vaccine: Marland Kitchen Difficulty breathing  . Swelling of face and throat  . A fast heartbeat  . A bad rash all over body  . Dizziness and weakness   Immunizations Administered    Name Date Dose VIS Date Route   Pfizer COVID-19 Vaccine 09/30/2019 12:49 PM 0.3 mL 07/06/2019 Intramuscular   Manufacturer: [place]   Lot: [id number]   [place]: [id number]

## 2019-11-07 DIAGNOSIS — K219 Gastro-esophageal reflux disease without esophagitis: Secondary | ICD-10-CM | POA: Diagnosis not present

## 2019-11-07 DIAGNOSIS — Z1211 Encounter for screening for malignant neoplasm of colon: Secondary | ICD-10-CM | POA: Diagnosis not present

## 2019-12-31 ENCOUNTER — Other Ambulatory Visit: Payer: Self-pay | Admitting: Nurse Practitioner

## 2019-12-31 ENCOUNTER — Other Ambulatory Visit: Payer: Self-pay | Admitting: Women's Health

## 2019-12-31 DIAGNOSIS — Z1231 Encounter for screening mammogram for malignant neoplasm of breast: Secondary | ICD-10-CM

## 2020-01-10 DIAGNOSIS — E889 Metabolic disorder, unspecified: Secondary | ICD-10-CM | POA: Diagnosis not present

## 2020-01-10 DIAGNOSIS — E559 Vitamin D deficiency, unspecified: Secondary | ICD-10-CM | POA: Diagnosis not present

## 2020-01-10 DIAGNOSIS — N183 Chronic kidney disease, stage 3 unspecified: Secondary | ICD-10-CM | POA: Diagnosis not present

## 2020-01-10 DIAGNOSIS — N1831 Chronic kidney disease, stage 3a: Secondary | ICD-10-CM | POA: Diagnosis not present

## 2020-01-10 DIAGNOSIS — I129 Hypertensive chronic kidney disease with stage 1 through stage 4 chronic kidney disease, or unspecified chronic kidney disease: Secondary | ICD-10-CM | POA: Diagnosis not present

## 2020-01-10 DIAGNOSIS — D631 Anemia in chronic kidney disease: Secondary | ICD-10-CM | POA: Diagnosis not present

## 2020-01-10 DIAGNOSIS — M9080 Osteopathy in diseases classified elsewhere, unspecified site: Secondary | ICD-10-CM | POA: Diagnosis not present

## 2020-01-24 ENCOUNTER — Ambulatory Visit (INDEPENDENT_AMBULATORY_CARE_PROVIDER_SITE_OTHER): Payer: Medicare HMO | Admitting: Nurse Practitioner

## 2020-01-24 ENCOUNTER — Encounter: Payer: Self-pay | Admitting: Nurse Practitioner

## 2020-01-24 ENCOUNTER — Other Ambulatory Visit: Payer: Self-pay

## 2020-01-24 VITALS — BP 115/73 | Ht 61.0 in | Wt 136.2 lb

## 2020-01-24 DIAGNOSIS — M85851 Other specified disorders of bone density and structure, right thigh: Secondary | ICD-10-CM

## 2020-01-24 DIAGNOSIS — N951 Menopausal and female climacteric states: Secondary | ICD-10-CM

## 2020-01-24 DIAGNOSIS — M8588 Other specified disorders of bone density and structure, other site: Secondary | ICD-10-CM

## 2020-01-24 DIAGNOSIS — Z01419 Encounter for gynecological examination (general) (routine) without abnormal findings: Secondary | ICD-10-CM | POA: Diagnosis not present

## 2020-01-24 DIAGNOSIS — Z90710 Acquired absence of both cervix and uterus: Secondary | ICD-10-CM

## 2020-01-24 NOTE — Progress Notes (Signed)
   Carolyn Wise 18-Sep-1950 [id number]   History:  69 y.o. MWF G2P1102 presents for breast and pelvic exam with complaints of vaginal dryness/pain with intercourse. 1999 TAH for fibroids, no HRT. History of osteopenia.  Primary care manages hypertension and hypercholesterolemia.  Urology manages CKD 3. she is very active by walking 5 miles most days and started back to the gym this week.  Gynecologic History No LMP recorded. Patient has had a hysterectomy.   Contraception: status post hysterectomy Last Pap: 2012, requests q 10 year  Last mammogram: 02/22/2019. Results were: Normal Last colonoscopy: 01/21/2010. Results were: Normal. Last Dexa: 02/14/2017. Results were: Osteopenia T score -1.5, FRAX 8.9%/0.9%  Past medical history, past surgical history, family history and social history were all reviewed and documented in the EPIC chart.  ROS:  A ROS was performed and pertinent positives and negatives are included.  Exam:  Vitals:   01/24/20 0908  BP: 115/73  Weight: 136 lb 3.2 oz (61.8 kg)  Height: 5\' 1"  (1.549 m)   Body mass index is 25.73 kg/m.  General appearance:  Normal Thyroid:  Symmetrical, normal in size, without palpable masses or nodularity. Respiratory  Auscultation:  Clear without wheezing or rhonchi Cardiovascular  Auscultation:  Regular rate, without rubs, murmurs or gallops  Edema/varicosities:  Not grossly evident Abdominal  Soft,nontender, without masses, guarding or rebound.  Liver/spleen:  No organomegaly noted  Hernia:  None appreciated  Skin  Inspection:  Grossly normal   Breasts: Examined lying and sitting.   Right: Without masses, retractions, discharge or axillary adenopathy.   Left: Without masses, retractions, discharge or axillary adenopathy. Gentitourinary   Inguinal/mons:  Normal without inguinal adenopathy  External genitalia:  Normal  BUS/Urethra/Skene's glands:  Normal  Vagina:  Normal  Cervix:  Absent  Uterus:   Absent  Adnexa/parametria:     Rt: Without masses or tenderness.   Lt: Without masses or tenderness.  Anus and perineum: Normal  Digital rectal exam: Normal sphincter tone without palpated masses or tenderness  Assessment/Plan:  69 y.o. [id number] for breast and pelvic exam.   Well female exam with routine gynecological exam - Education provided on SBEs, importance of preventative screenings, current guidelines, high calcium diet, regular exercise, and multivitamin daily. Discussed pap guidelines and she would like to do one more next year and then stop.   Menopausal vaginal dryness - recommend coconut oil. She has tried maintenance lubricants with little change. No longer doing Estring.   Osteopenia of neck of right femur - Overdue for DEXA. Will try to schedule this with her mammogram at the breast center next month, if unable she will schedule it here. Vitamin D supplement, continue regular exercise and weightbearing exercises. Fall prevention and home safety.   History of total abdominal hysterectomy - 1999 for fibroids, no HRT  Follow-up in 1 year for annual    [place], 9:30 AM 01/24/2020

## 2020-01-24 NOTE — Patient Instructions (Addendum)
Coconut oil for lubricant  Health Maintenance After Age 68 After age 26, you are at a higher risk for certain long-term diseases and infections as well as injuries from falls. Falls are a major cause of broken bones and head injuries in people who are older than age 87. Getting regular preventive care can help to keep you healthy and well. Preventive care includes getting regular testing and making lifestyle changes as recommended by your health care provider. Talk with your health care provider about:  Which screenings and tests you should have. A screening is a test that checks for a disease when you have no symptoms.  A diet and exercise plan that is right for you. What should I know about screenings and tests to prevent falls? Screening and testing are the best ways to find a health problem early. Early diagnosis and treatment give you the best chance of managing medical conditions that are common after age 92. Certain conditions and lifestyle choices may make you more likely to have a fall. Your health care provider may recommend:  Regular vision checks. Poor vision and conditions such as cataracts can make you more likely to have a fall. If you wear glasses, make sure to get your prescription updated if your vision changes.  Medicine review. Work with your health care provider to regularly review all of the medicines you are taking, including over-the-counter medicines. Ask your health care provider about any side effects that may make you more likely to have a fall. Tell your health care provider if any medicines that you take make you feel dizzy or sleepy.  Osteoporosis screening. Osteoporosis is a condition that causes the bones to get weaker. This can make the bones weak and cause them to break more easily.  Blood pressure screening. Blood pressure changes and medicines to control blood pressure can make you feel dizzy.  Strength and balance checks. Your health care provider may  recommend certain tests to check your strength and balance while standing, walking, or changing positions.  Foot health exam. Foot pain and numbness, as well as not wearing proper footwear, can make you more likely to have a fall.  Depression screening. You may be more likely to have a fall if you have a fear of falling, feel emotionally low, or feel unable to do activities that you used to do.  Alcohol use screening. Using too much alcohol can affect your balance and may make you more likely to have a fall. What actions can I take to lower my risk of falls? General instructions  Talk with your health care provider about your risks for falling. Tell your health care provider if: ? You fall. Be sure to tell your health care provider about all falls, even ones that seem minor. ? You feel dizzy, sleepy, or off-balance.  Take over-the-counter and prescription medicines only as told by your health care provider. These include any supplements.  Eat a healthy diet and maintain a healthy weight. A healthy diet includes low-fat dairy products, low-fat (lean) meats, and fiber from whole grains, beans, and lots of fruits and vegetables. Home safety  Remove any tripping hazards, such as rugs, cords, and clutter.  Install safety equipment such as grab bars in bathrooms and safety rails on stairs.  Keep rooms and walkways well-lit. Activity   Follow a regular exercise program to stay fit. This will help you maintain your balance. Ask your health care provider what types of exercise are appropriate for you.  If  you need a cane or walker, use it as recommended by your health care provider.  Wear supportive shoes that have nonskid soles. Lifestyle  Do not drink alcohol if your health care provider tells you not to drink.  If you drink alcohol, limit how much you have: ? 0-1 drink a day for women. ? 0-2 drinks a day for men.  Be aware of how much alcohol is in your drink. In the [place] one drink  equals one typical bottle of beer (12 oz), one-half glass of wine (5 oz), or one shot of hard liquor (1 oz).  Do not use any products that contain nicotine or tobacco, such as cigarettes and e-cigarettes. If you need help quitting, ask your health care provider. Summary  Having a healthy lifestyle and getting preventive care can help to protect your health and wellness after age 25.  Screening and testing are the best way to find a health problem early and help you avoid having a fall. Early diagnosis and treatment give you the best chance for managing medical conditions that are more common for people who are older than age 56.  Falls are a major cause of broken bones and head injuries in people who are older than age 39. Take precautions to prevent a fall at home.  Work with your health care provider to learn what changes you can make to improve your health and wellness and to prevent falls. This information is not intended to replace advice given to you by your health care provider. Make sure you discuss any questions you have with your health care provider. Document Revised: 11/02/2018 Document Reviewed: 05/25/2017 Elsevier Patient Education  2020 [employer].

## 2020-01-31 ENCOUNTER — Telehealth: Payer: Self-pay | Admitting: *Deleted

## 2020-01-31 DIAGNOSIS — M8580 Other specified disorders of bone density and structure, unspecified site: Secondary | ICD-10-CM

## 2020-01-31 NOTE — Telephone Encounter (Signed)
Patient called requesting dexa order placed at the breast center, order placed. Patient aware she can call to schedule.

## 2020-02-04 ENCOUNTER — Other Ambulatory Visit: Payer: Self-pay

## 2020-02-04 ENCOUNTER — Ambulatory Visit
Admission: RE | Admit: 2020-02-04 | Discharge: 2020-02-04 | Disposition: A | Discharge status: Home / Self Care | Payer: Medicare HMO | Source: Ambulatory Visit | Attending: Nurse Practitioner | Admitting: Nurse Practitioner

## 2020-02-04 DIAGNOSIS — Z780 Asymptomatic menopausal state: Secondary | ICD-10-CM | POA: Diagnosis not present

## 2020-02-04 DIAGNOSIS — M85852 Other specified disorders of bone density and structure, left thigh: Secondary | ICD-10-CM | POA: Diagnosis not present

## 2020-02-04 DIAGNOSIS — M8580 Other specified disorders of bone density and structure, unspecified site: Secondary | ICD-10-CM

## 2020-02-11 DIAGNOSIS — J0110 Acute frontal sinusitis, unspecified: Secondary | ICD-10-CM | POA: Diagnosis not present

## 2020-02-18 DIAGNOSIS — Z1211 Encounter for screening for malignant neoplasm of colon: Secondary | ICD-10-CM | POA: Diagnosis not present

## 2020-02-25 ENCOUNTER — Ambulatory Visit
Admission: RE | Admit: 2020-02-25 | Discharge: 2020-02-25 | Disposition: A | Discharge status: Home / Self Care | Payer: Medicare HMO | Source: Ambulatory Visit | Attending: Nurse Practitioner | Admitting: Nurse Practitioner

## 2020-02-25 ENCOUNTER — Other Ambulatory Visit: Payer: Self-pay

## 2020-02-25 DIAGNOSIS — Z1231 Encounter for screening mammogram for malignant neoplasm of breast: Secondary | ICD-10-CM | POA: Diagnosis not present

## 2020-04-02 DIAGNOSIS — R1310 Dysphagia, unspecified: Secondary | ICD-10-CM | POA: Diagnosis not present

## 2020-04-02 DIAGNOSIS — E041 Nontoxic single thyroid nodule: Secondary | ICD-10-CM | POA: Diagnosis not present

## 2020-04-02 DIAGNOSIS — M8589 Other specified disorders of bone density and structure, multiple sites: Secondary | ICD-10-CM | POA: Diagnosis not present

## 2020-04-02 DIAGNOSIS — N183 Chronic kidney disease, stage 3 unspecified: Secondary | ICD-10-CM | POA: Diagnosis not present

## 2020-04-02 DIAGNOSIS — Z0000 Encounter for general adult medical examination without abnormal findings: Secondary | ICD-10-CM | POA: Diagnosis not present

## 2020-04-02 DIAGNOSIS — E785 Hyperlipidemia, unspecified: Secondary | ICD-10-CM | POA: Diagnosis not present

## 2020-04-02 DIAGNOSIS — Z23 Encounter for immunization: Secondary | ICD-10-CM | POA: Diagnosis not present

## 2020-04-02 DIAGNOSIS — E559 Vitamin D deficiency, unspecified: Secondary | ICD-10-CM | POA: Diagnosis not present

## 2020-04-02 DIAGNOSIS — I10 Essential (primary) hypertension: Secondary | ICD-10-CM | POA: Diagnosis not present

## 2020-04-03 DIAGNOSIS — L820 Inflamed seborrheic keratosis: Secondary | ICD-10-CM | POA: Diagnosis not present

## 2020-04-03 DIAGNOSIS — L821 Other seborrheic keratosis: Secondary | ICD-10-CM | POA: Diagnosis not present

## 2020-04-03 DIAGNOSIS — D045 Carcinoma in situ of skin of trunk: Secondary | ICD-10-CM | POA: Diagnosis not present

## 2020-04-03 DIAGNOSIS — D2262 Melanocytic nevi of left upper limb, including shoulder: Secondary | ICD-10-CM | POA: Diagnosis not present

## 2020-04-03 DIAGNOSIS — Z85828 Personal history of other malignant neoplasm of skin: Secondary | ICD-10-CM | POA: Diagnosis not present

## 2020-04-03 DIAGNOSIS — C44612 Basal cell carcinoma of skin of right upper limb, including shoulder: Secondary | ICD-10-CM | POA: Diagnosis not present

## 2020-04-03 DIAGNOSIS — D225 Melanocytic nevi of trunk: Secondary | ICD-10-CM | POA: Diagnosis not present

## 2020-04-03 DIAGNOSIS — L570 Actinic keratosis: Secondary | ICD-10-CM | POA: Diagnosis not present

## 2020-04-03 DIAGNOSIS — L814 Other melanin hyperpigmentation: Secondary | ICD-10-CM | POA: Diagnosis not present

## 2020-04-03 DIAGNOSIS — D1801 Hemangioma of skin and subcutaneous tissue: Secondary | ICD-10-CM | POA: Diagnosis not present

## 2020-04-08 ENCOUNTER — Other Ambulatory Visit: Payer: Self-pay | Admitting: Family Medicine

## 2020-04-08 DIAGNOSIS — D631 Anemia in chronic kidney disease: Secondary | ICD-10-CM | POA: Diagnosis not present

## 2020-04-08 DIAGNOSIS — E889 Metabolic disorder, unspecified: Secondary | ICD-10-CM | POA: Diagnosis not present

## 2020-04-08 DIAGNOSIS — N1831 Chronic kidney disease, stage 3a: Secondary | ICD-10-CM | POA: Diagnosis not present

## 2020-04-08 DIAGNOSIS — E875 Hyperkalemia: Secondary | ICD-10-CM | POA: Diagnosis not present

## 2020-04-08 DIAGNOSIS — M9080 Osteopathy in diseases classified elsewhere, unspecified site: Secondary | ICD-10-CM | POA: Diagnosis not present

## 2020-04-08 DIAGNOSIS — E041 Nontoxic single thyroid nodule: Secondary | ICD-10-CM

## 2020-04-08 DIAGNOSIS — E559 Vitamin D deficiency, unspecified: Secondary | ICD-10-CM | POA: Diagnosis not present

## 2020-04-08 DIAGNOSIS — N183 Chronic kidney disease, stage 3 unspecified: Secondary | ICD-10-CM | POA: Diagnosis not present

## 2020-04-08 DIAGNOSIS — I129 Hypertensive chronic kidney disease with stage 1 through stage 4 chronic kidney disease, or unspecified chronic kidney disease: Secondary | ICD-10-CM | POA: Diagnosis not present

## 2020-04-22 ENCOUNTER — Ambulatory Visit
Admission: RE | Admit: 2020-04-22 | Discharge: 2020-04-22 | Disposition: A | Discharge status: Home / Self Care | Payer: Medicare HMO | Source: Ambulatory Visit | Attending: Family Medicine | Admitting: Family Medicine

## 2020-04-22 DIAGNOSIS — E042 Nontoxic multinodular goiter: Secondary | ICD-10-CM | POA: Diagnosis not present

## 2020-04-22 DIAGNOSIS — E041 Nontoxic single thyroid nodule: Secondary | ICD-10-CM

## 2020-05-06 ENCOUNTER — Other Ambulatory Visit (HOSPITAL_COMMUNITY): Payer: Self-pay

## 2020-05-06 DIAGNOSIS — R1310 Dysphagia, unspecified: Secondary | ICD-10-CM

## 2020-05-19 DIAGNOSIS — J0110 Acute frontal sinusitis, unspecified: Secondary | ICD-10-CM | POA: Diagnosis not present

## 2020-05-20 ENCOUNTER — Ambulatory Visit (HOSPITAL_COMMUNITY)
Admission: RE | Admit: 2020-05-20 | Discharge: 2020-05-20 | Disposition: A | Discharge status: Home / Self Care | Payer: Medicare HMO | Source: Ambulatory Visit | Attending: Family Medicine | Admitting: Family Medicine

## 2020-05-20 ENCOUNTER — Other Ambulatory Visit: Payer: Self-pay

## 2020-05-20 DIAGNOSIS — R059 Cough, unspecified: Secondary | ICD-10-CM | POA: Diagnosis not present

## 2020-05-20 DIAGNOSIS — R1310 Dysphagia, unspecified: Secondary | ICD-10-CM | POA: Insufficient documentation

## 2020-05-20 NOTE — Progress Notes (Signed)
Modified Barium Swallow Progress Note  Patient Details  Name: Carolyn Wise MRN: [id number] Date of Birth: 1951-05-04  Today's Date: 05/20/2020  Modified Barium Swallow completed.  Full report located under Chart Review in the Imaging Section.  Brief recommendations include the following:  Clinical Impression  Pt was seen for MBS which revealed a functional oropharyngeal swallow. Across POs, pt demonstrated adequate oral manipulation and transit as well as timely pharyngeal swallow without overt residue. No aspiration was observed. Marginally slowed passage of solid PO through the esophagus was observed, consistent with findings from previous esophagram. Pt was educated on findings of study and esophageal dysphagia. No SLP f/u recommended at this time.    Swallow Evaluation Recommendations   Recommended Consults: Consider esophageal assessment   SLP Diet Recommendations: Regular solids;Thin liquid   Liquid Administration via: Cup;Straw   Medication Administration: Whole meds with liquid   Supervision: Patient able to self feed           Oral Care Recommendations: Oral care BID        Greggory Keen 05/20/2020,11:39 AM

## 2020-09-15 DIAGNOSIS — K625 Hemorrhage of anus and rectum: Secondary | ICD-10-CM | POA: Diagnosis not present

## 2020-10-02 DIAGNOSIS — H524 Presbyopia: Secondary | ICD-10-CM | POA: Diagnosis not present

## 2020-10-02 DIAGNOSIS — H2513 Age-related nuclear cataract, bilateral: Secondary | ICD-10-CM | POA: Diagnosis not present

## 2020-10-02 DIAGNOSIS — H04123 Dry eye syndrome of bilateral lacrimal glands: Secondary | ICD-10-CM | POA: Diagnosis not present

## 2020-10-02 DIAGNOSIS — H35363 Drusen (degenerative) of macula, bilateral: Secondary | ICD-10-CM | POA: Diagnosis not present

## 2020-10-07 DIAGNOSIS — N183 Chronic kidney disease, stage 3 unspecified: Secondary | ICD-10-CM | POA: Diagnosis not present

## 2020-10-07 DIAGNOSIS — I129 Hypertensive chronic kidney disease with stage 1 through stage 4 chronic kidney disease, or unspecified chronic kidney disease: Secondary | ICD-10-CM | POA: Diagnosis not present

## 2020-10-08 DIAGNOSIS — N183 Chronic kidney disease, stage 3 unspecified: Secondary | ICD-10-CM | POA: Diagnosis not present

## 2020-10-08 DIAGNOSIS — E889 Metabolic disorder, unspecified: Secondary | ICD-10-CM | POA: Diagnosis not present

## 2020-10-08 DIAGNOSIS — D631 Anemia in chronic kidney disease: Secondary | ICD-10-CM | POA: Diagnosis not present

## 2020-10-08 DIAGNOSIS — M9080 Osteopathy in diseases classified elsewhere, unspecified site: Secondary | ICD-10-CM | POA: Diagnosis not present

## 2020-10-08 DIAGNOSIS — N1831 Chronic kidney disease, stage 3a: Secondary | ICD-10-CM | POA: Diagnosis not present

## 2020-10-08 DIAGNOSIS — I129 Hypertensive chronic kidney disease with stage 1 through stage 4 chronic kidney disease, or unspecified chronic kidney disease: Secondary | ICD-10-CM | POA: Diagnosis not present

## 2020-10-08 DIAGNOSIS — E559 Vitamin D deficiency, unspecified: Secondary | ICD-10-CM | POA: Diagnosis not present

## 2020-10-09 DIAGNOSIS — C44529 Squamous cell carcinoma of skin of other part of trunk: Secondary | ICD-10-CM | POA: Diagnosis not present

## 2021-01-12 ENCOUNTER — Other Ambulatory Visit: Payer: Self-pay | Admitting: Nurse Practitioner

## 2021-01-12 DIAGNOSIS — Z1231 Encounter for screening mammogram for malignant neoplasm of breast: Secondary | ICD-10-CM

## 2021-03-05 ENCOUNTER — Ambulatory Visit (INDEPENDENT_AMBULATORY_CARE_PROVIDER_SITE_OTHER): Payer: Medicare HMO | Admitting: Nurse Practitioner

## 2021-03-05 ENCOUNTER — Other Ambulatory Visit: Payer: Self-pay

## 2021-03-05 ENCOUNTER — Encounter: Payer: Self-pay | Admitting: Nurse Practitioner

## 2021-03-05 VITALS — BP 124/80 | Ht 60.0 in | Wt 136.0 lb

## 2021-03-05 DIAGNOSIS — Z01419 Encounter for gynecological examination (general) (routine) without abnormal findings: Secondary | ICD-10-CM

## 2021-03-05 DIAGNOSIS — Z780 Asymptomatic menopausal state: Secondary | ICD-10-CM

## 2021-03-05 DIAGNOSIS — M8580 Other specified disorders of bone density and structure, unspecified site: Secondary | ICD-10-CM

## 2021-03-05 NOTE — Progress Notes (Signed)
   Carolyn Wise 12-07-1950 [id number]   History:  70 y.o. MWF G2P1102 presents for breast and pelvic exam without GYN complaints. Postmenopausal - no HRT. S/P 1999 TAH. Normal pap and mammogram history. CKD3 managed by nephrology, history of basal cell and squamous cell cancer managed by derm.   Gynecologic History No LMP recorded. Patient has had a hysterectomy.   Contraception: status post hysterectomy  Health Maintenance Last Pap: 2012. No longer screening per guidelines Last mammogram: 02/25/2020. Results were: Normal Last colonoscopy: 01/22/2020. Results were: Normal, 10-year recall Last Dexa: 02/04/2020. Results were: Osteopenia T score -1.5, FRAX 9.5% / 1.1%  Past medical history, past surgical history, family history and social history were all reviewed and documented in the EPIC chart. Married. Retired Oncologist.   ROS:  A ROS was performed and pertinent positives and negatives are included.  Exam:  Vitals:   03/05/21 0829  BP: 124/80  Weight: 136 lb (61.7 kg)  Height: 5' (1.524 m)   Body mass index is 26.56 kg/m.  General appearance:  Normal Thyroid:  Symmetrical, normal in size, without palpable masses or nodularity. Respiratory  Auscultation:  Clear without wheezing or rhonchi Cardiovascular  Auscultation:  Regular rate, without rubs, murmurs or gallops  Edema/varicosities:  Not grossly evident Abdominal  Soft,nontender, without masses, guarding or rebound.  Liver/spleen:  No organomegaly noted  Hernia:  None appreciated  Skin  Inspection:  Grossly normal   Breasts: Examined lying and sitting.   Right: Without masses, retractions, discharge or axillary adenopathy.   Left: Without masses, retractions, discharge or axillary adenopathy. Gentitourinary   Inguinal/mons:  Normal without inguinal adenopathy  External genitalia:  Normal  BUS/Urethra/Skene's glands:  Normal  Vagina:  Normal  Cervix:  Absent  Uterus:  Absent  Anus and  perineum: Normal  Digital rectal exam: Normal sphincter tone without palpated masses or tenderness  Assessment/Plan:  70 y.o. [id number] for breast and pelvic exam.   Well female exam with routine gynecological exam - Education provided on SBEs, importance of preventative screenings, current guidelines, high calcium diet, regular exercise, and multivitamin daily. Labs done elsewhere.   Postmenopausal - no HRT. S/P 1999 TAH.   Osteopenia, unspecified location - 01/2020 T-score -1.5 without elevated FRAX. Continue high calcium diet, vitamin D supplement, and regular exercise. Will repeat Dexa next year.   Screening for cervical cancer - Normal Pap history. No longer screening per guidelines.   Screening for breast cancer - Normal mammogram history.  Continue annual screenings.  Normal breast exam today. Mammogram scheduled tomorrow.   Screening for colon cancer - 2021 colonoscopy. Will repeat at GI's recommended interval.   Follow-up in 1 year for annual    [place], 9:00 AM 03/05/2021

## 2021-03-06 ENCOUNTER — Ambulatory Visit
Admission: RE | Admit: 2021-03-06 | Discharge: 2021-03-06 | Disposition: A | Discharge status: Home / Self Care | Payer: Medicare HMO | Source: Ambulatory Visit | Attending: Nurse Practitioner | Admitting: Nurse Practitioner

## 2021-03-06 DIAGNOSIS — Z1231 Encounter for screening mammogram for malignant neoplasm of breast: Secondary | ICD-10-CM

## 2021-04-07 DIAGNOSIS — I10 Essential (primary) hypertension: Secondary | ICD-10-CM | POA: Diagnosis not present

## 2021-04-07 DIAGNOSIS — E559 Vitamin D deficiency, unspecified: Secondary | ICD-10-CM | POA: Diagnosis not present

## 2021-04-07 DIAGNOSIS — Z23 Encounter for immunization: Secondary | ICD-10-CM | POA: Diagnosis not present

## 2021-04-07 DIAGNOSIS — N3941 Urge incontinence: Secondary | ICD-10-CM | POA: Diagnosis not present

## 2021-04-07 DIAGNOSIS — N183 Chronic kidney disease, stage 3 unspecified: Secondary | ICD-10-CM | POA: Diagnosis not present

## 2021-04-07 DIAGNOSIS — Z0000 Encounter for general adult medical examination without abnormal findings: Secondary | ICD-10-CM | POA: Diagnosis not present

## 2021-04-07 DIAGNOSIS — E785 Hyperlipidemia, unspecified: Secondary | ICD-10-CM | POA: Diagnosis not present

## 2021-04-07 DIAGNOSIS — E041 Nontoxic single thyroid nodule: Secondary | ICD-10-CM | POA: Diagnosis not present

## 2021-04-09 DIAGNOSIS — L570 Actinic keratosis: Secondary | ICD-10-CM | POA: Diagnosis not present

## 2021-04-09 DIAGNOSIS — Z85828 Personal history of other malignant neoplasm of skin: Secondary | ICD-10-CM | POA: Diagnosis not present

## 2021-04-09 DIAGNOSIS — D225 Melanocytic nevi of trunk: Secondary | ICD-10-CM | POA: Diagnosis not present

## 2021-04-09 DIAGNOSIS — L821 Other seborrheic keratosis: Secondary | ICD-10-CM | POA: Diagnosis not present

## 2021-04-09 DIAGNOSIS — L820 Inflamed seborrheic keratosis: Secondary | ICD-10-CM | POA: Diagnosis not present

## 2021-04-09 DIAGNOSIS — C44519 Basal cell carcinoma of skin of other part of trunk: Secondary | ICD-10-CM | POA: Diagnosis not present

## 2021-04-09 DIAGNOSIS — L814 Other melanin hyperpigmentation: Secondary | ICD-10-CM | POA: Diagnosis not present

## 2021-04-16 DIAGNOSIS — N1831 Chronic kidney disease, stage 3a: Secondary | ICD-10-CM | POA: Diagnosis not present

## 2021-04-16 DIAGNOSIS — I129 Hypertensive chronic kidney disease with stage 1 through stage 4 chronic kidney disease, or unspecified chronic kidney disease: Secondary | ICD-10-CM | POA: Diagnosis not present

## 2021-04-16 DIAGNOSIS — N183 Chronic kidney disease, stage 3 unspecified: Secondary | ICD-10-CM | POA: Diagnosis not present

## 2021-04-16 DIAGNOSIS — E559 Vitamin D deficiency, unspecified: Secondary | ICD-10-CM | POA: Diagnosis not present

## 2021-04-16 DIAGNOSIS — M9080 Osteopathy in diseases classified elsewhere, unspecified site: Secondary | ICD-10-CM | POA: Diagnosis not present

## 2021-04-16 DIAGNOSIS — E889 Metabolic disorder, unspecified: Secondary | ICD-10-CM | POA: Diagnosis not present

## 2021-04-16 DIAGNOSIS — D631 Anemia in chronic kidney disease: Secondary | ICD-10-CM | POA: Diagnosis not present

## 2021-05-26 DIAGNOSIS — N3946 Mixed incontinence: Secondary | ICD-10-CM | POA: Diagnosis not present

## 2021-05-26 DIAGNOSIS — R351 Nocturia: Secondary | ICD-10-CM | POA: Diagnosis not present

## 2021-05-26 DIAGNOSIS — R350 Frequency of micturition: Secondary | ICD-10-CM | POA: Diagnosis not present

## 2021-07-09 DIAGNOSIS — R350 Frequency of micturition: Secondary | ICD-10-CM | POA: Diagnosis not present

## 2021-07-09 DIAGNOSIS — N3946 Mixed incontinence: Secondary | ICD-10-CM | POA: Diagnosis not present

## 2021-10-08 DIAGNOSIS — H35363 Drusen (degenerative) of macula, bilateral: Secondary | ICD-10-CM | POA: Diagnosis not present

## 2021-10-08 DIAGNOSIS — H5203 Hypermetropia, bilateral: Secondary | ICD-10-CM | POA: Diagnosis not present

## 2021-10-08 DIAGNOSIS — H2513 Age-related nuclear cataract, bilateral: Secondary | ICD-10-CM | POA: Diagnosis not present

## 2021-11-23 ENCOUNTER — Other Ambulatory Visit: Payer: Self-pay | Admitting: Nurse Practitioner

## 2021-11-23 DIAGNOSIS — Z1231 Encounter for screening mammogram for malignant neoplasm of breast: Secondary | ICD-10-CM

## 2021-12-02 ENCOUNTER — Other Ambulatory Visit: Payer: Self-pay | Admitting: Family Medicine

## 2021-12-02 ENCOUNTER — Ambulatory Visit: Payer: Self-pay

## 2021-12-02 DIAGNOSIS — M79674 Pain in right toe(s): Secondary | ICD-10-CM

## 2022-01-11 DIAGNOSIS — L570 Actinic keratosis: Secondary | ICD-10-CM | POA: Diagnosis not present

## 2022-01-11 DIAGNOSIS — L820 Inflamed seborrheic keratosis: Secondary | ICD-10-CM | POA: Diagnosis not present

## 2022-01-15 DIAGNOSIS — K219 Gastro-esophageal reflux disease without esophagitis: Secondary | ICD-10-CM | POA: Diagnosis not present

## 2022-01-15 DIAGNOSIS — M85852 Other specified disorders of bone density and structure, left thigh: Secondary | ICD-10-CM | POA: Diagnosis not present

## 2022-01-15 DIAGNOSIS — R1011 Right upper quadrant pain: Secondary | ICD-10-CM | POA: Diagnosis not present

## 2022-01-15 DIAGNOSIS — M85851 Other specified disorders of bone density and structure, right thigh: Secondary | ICD-10-CM | POA: Diagnosis not present

## 2022-01-18 ENCOUNTER — Other Ambulatory Visit: Payer: Self-pay | Admitting: Family Medicine

## 2022-01-18 DIAGNOSIS — R1011 Right upper quadrant pain: Secondary | ICD-10-CM

## 2022-01-20 ENCOUNTER — Ambulatory Visit
Admission: RE | Admit: 2022-01-20 | Discharge: 2022-01-20 | Disposition: A | Discharge status: Home / Self Care | Payer: Medicare HMO | Source: Ambulatory Visit | Attending: Family Medicine | Admitting: Family Medicine

## 2022-01-20 ENCOUNTER — Other Ambulatory Visit: Payer: Self-pay | Admitting: Family Medicine

## 2022-01-20 DIAGNOSIS — K824 Cholesterolosis of gallbladder: Secondary | ICD-10-CM | POA: Diagnosis not present

## 2022-01-20 DIAGNOSIS — R110 Nausea: Secondary | ICD-10-CM | POA: Diagnosis not present

## 2022-01-20 DIAGNOSIS — R1011 Right upper quadrant pain: Secondary | ICD-10-CM | POA: Diagnosis not present

## 2022-01-20 DIAGNOSIS — M8588 Other specified disorders of bone density and structure, other site: Secondary | ICD-10-CM

## 2022-02-11 ENCOUNTER — Inpatient Hospital Stay: Admission: RE | Admit: 2022-02-11 | Payer: Medicare HMO | Source: Ambulatory Visit

## 2022-02-15 DIAGNOSIS — K219 Gastro-esophageal reflux disease without esophagitis: Secondary | ICD-10-CM | POA: Diagnosis not present

## 2022-02-15 DIAGNOSIS — R1310 Dysphagia, unspecified: Secondary | ICD-10-CM | POA: Diagnosis not present

## 2022-02-15 DIAGNOSIS — R1011 Right upper quadrant pain: Secondary | ICD-10-CM | POA: Diagnosis not present

## 2022-02-18 ENCOUNTER — Ambulatory Visit
Admission: RE | Admit: 2022-02-18 | Discharge: 2022-02-18 | Disposition: A | Discharge status: Home / Self Care | Payer: Medicare HMO | Source: Ambulatory Visit | Attending: Family Medicine | Admitting: Family Medicine

## 2022-02-18 DIAGNOSIS — M8588 Other specified disorders of bone density and structure, other site: Secondary | ICD-10-CM

## 2022-02-18 DIAGNOSIS — Z780 Asymptomatic menopausal state: Secondary | ICD-10-CM | POA: Diagnosis not present

## 2022-02-18 DIAGNOSIS — M85852 Other specified disorders of bone density and structure, left thigh: Secondary | ICD-10-CM | POA: Diagnosis not present

## 2022-03-08 ENCOUNTER — Ambulatory Visit
Admission: RE | Admit: 2022-03-08 | Discharge: 2022-03-08 | Disposition: A | Discharge status: Home / Self Care | Payer: Medicare HMO | Source: Ambulatory Visit | Attending: Nurse Practitioner | Admitting: Nurse Practitioner

## 2022-03-08 DIAGNOSIS — Z1231 Encounter for screening mammogram for malignant neoplasm of breast: Secondary | ICD-10-CM

## 2022-03-11 DIAGNOSIS — K2930 Chronic superficial gastritis without bleeding: Secondary | ICD-10-CM | POA: Diagnosis not present

## 2022-03-11 DIAGNOSIS — K317 Polyp of stomach and duodenum: Secondary | ICD-10-CM | POA: Diagnosis not present

## 2022-03-11 DIAGNOSIS — R1310 Dysphagia, unspecified: Secondary | ICD-10-CM | POA: Diagnosis not present

## 2022-03-11 DIAGNOSIS — K222 Esophageal obstruction: Secondary | ICD-10-CM | POA: Diagnosis not present

## 2022-03-11 DIAGNOSIS — K2100 Gastro-esophageal reflux disease with esophagitis, without bleeding: Secondary | ICD-10-CM | POA: Diagnosis not present

## 2022-03-11 DIAGNOSIS — R1011 Right upper quadrant pain: Secondary | ICD-10-CM | POA: Diagnosis not present

## 2022-03-11 DIAGNOSIS — K449 Diaphragmatic hernia without obstruction or gangrene: Secondary | ICD-10-CM | POA: Diagnosis not present

## 2022-03-15 DIAGNOSIS — K317 Polyp of stomach and duodenum: Secondary | ICD-10-CM | POA: Diagnosis not present

## 2022-03-15 DIAGNOSIS — K2930 Chronic superficial gastritis without bleeding: Secondary | ICD-10-CM | POA: Diagnosis not present

## 2022-03-25 DIAGNOSIS — K824 Cholesterolosis of gallbladder: Secondary | ICD-10-CM | POA: Diagnosis not present

## 2022-04-02 DIAGNOSIS — M549 Dorsalgia, unspecified: Secondary | ICD-10-CM | POA: Diagnosis not present

## 2022-04-02 DIAGNOSIS — K824 Cholesterolosis of gallbladder: Secondary | ICD-10-CM | POA: Diagnosis not present

## 2022-04-02 DIAGNOSIS — E559 Vitamin D deficiency, unspecified: Secondary | ICD-10-CM | POA: Diagnosis not present

## 2022-04-02 DIAGNOSIS — K219 Gastro-esophageal reflux disease without esophagitis: Secondary | ICD-10-CM | POA: Diagnosis not present

## 2022-04-02 DIAGNOSIS — Z0000 Encounter for general adult medical examination without abnormal findings: Secondary | ICD-10-CM | POA: Diagnosis not present

## 2022-04-02 DIAGNOSIS — E041 Nontoxic single thyroid nodule: Secondary | ICD-10-CM | POA: Diagnosis not present

## 2022-04-02 DIAGNOSIS — H9191 Unspecified hearing loss, right ear: Secondary | ICD-10-CM | POA: Diagnosis not present

## 2022-04-02 DIAGNOSIS — I10 Essential (primary) hypertension: Secondary | ICD-10-CM | POA: Diagnosis not present

## 2022-04-02 DIAGNOSIS — N183 Chronic kidney disease, stage 3 unspecified: Secondary | ICD-10-CM | POA: Diagnosis not present

## 2022-04-02 DIAGNOSIS — E785 Hyperlipidemia, unspecified: Secondary | ICD-10-CM | POA: Diagnosis not present

## 2022-04-02 DIAGNOSIS — Z23 Encounter for immunization: Secondary | ICD-10-CM | POA: Diagnosis not present

## 2022-04-02 DIAGNOSIS — N3941 Urge incontinence: Secondary | ICD-10-CM | POA: Diagnosis not present

## 2022-05-06 DIAGNOSIS — D72829 Elevated white blood cell count, unspecified: Secondary | ICD-10-CM | POA: Diagnosis not present

## 2022-05-14 DIAGNOSIS — J019 Acute sinusitis, unspecified: Secondary | ICD-10-CM | POA: Diagnosis not present

## 2022-05-26 HISTORY — PX: CHOLECYSTECTOMY: SHX55

## 2022-06-10 DIAGNOSIS — N183 Chronic kidney disease, stage 3 unspecified: Secondary | ICD-10-CM | POA: Diagnosis not present

## 2022-06-10 DIAGNOSIS — E559 Vitamin D deficiency, unspecified: Secondary | ICD-10-CM | POA: Diagnosis not present

## 2022-06-10 DIAGNOSIS — I129 Hypertensive chronic kidney disease with stage 1 through stage 4 chronic kidney disease, or unspecified chronic kidney disease: Secondary | ICD-10-CM | POA: Diagnosis not present

## 2022-06-11 ENCOUNTER — Other Ambulatory Visit: Payer: Self-pay | Admitting: Surgery

## 2022-06-11 DIAGNOSIS — K824 Cholesterolosis of gallbladder: Secondary | ICD-10-CM | POA: Diagnosis not present

## 2022-06-11 DIAGNOSIS — K811 Chronic cholecystitis: Secondary | ICD-10-CM | POA: Diagnosis not present

## 2022-06-15 DIAGNOSIS — N1831 Chronic kidney disease, stage 3a: Secondary | ICD-10-CM | POA: Diagnosis not present

## 2022-06-15 DIAGNOSIS — M898X9 Other specified disorders of bone, unspecified site: Secondary | ICD-10-CM | POA: Diagnosis not present

## 2022-06-15 DIAGNOSIS — E559 Vitamin D deficiency, unspecified: Secondary | ICD-10-CM | POA: Diagnosis not present

## 2022-06-15 DIAGNOSIS — I129 Hypertensive chronic kidney disease with stage 1 through stage 4 chronic kidney disease, or unspecified chronic kidney disease: Secondary | ICD-10-CM | POA: Diagnosis not present

## 2022-06-15 DIAGNOSIS — N183 Chronic kidney disease, stage 3 unspecified: Secondary | ICD-10-CM | POA: Diagnosis not present

## 2022-06-15 DIAGNOSIS — D631 Anemia in chronic kidney disease: Secondary | ICD-10-CM | POA: Diagnosis not present

## 2022-07-01 DIAGNOSIS — E785 Hyperlipidemia, unspecified: Secondary | ICD-10-CM | POA: Diagnosis not present

## 2022-07-01 DIAGNOSIS — N183 Chronic kidney disease, stage 3 unspecified: Secondary | ICD-10-CM | POA: Diagnosis not present

## 2022-07-01 DIAGNOSIS — E559 Vitamin D deficiency, unspecified: Secondary | ICD-10-CM | POA: Diagnosis not present

## 2022-07-01 DIAGNOSIS — E041 Nontoxic single thyroid nodule: Secondary | ICD-10-CM | POA: Diagnosis not present

## 2022-08-26 DIAGNOSIS — D1801 Hemangioma of skin and subcutaneous tissue: Secondary | ICD-10-CM | POA: Diagnosis not present

## 2022-08-26 DIAGNOSIS — L814 Other melanin hyperpigmentation: Secondary | ICD-10-CM | POA: Diagnosis not present

## 2022-08-26 DIAGNOSIS — C44722 Squamous cell carcinoma of skin of right lower limb, including hip: Secondary | ICD-10-CM | POA: Diagnosis not present

## 2022-08-26 DIAGNOSIS — Z85828 Personal history of other malignant neoplasm of skin: Secondary | ICD-10-CM | POA: Diagnosis not present

## 2022-08-26 DIAGNOSIS — L821 Other seborrheic keratosis: Secondary | ICD-10-CM | POA: Diagnosis not present

## 2022-08-26 DIAGNOSIS — L570 Actinic keratosis: Secondary | ICD-10-CM | POA: Diagnosis not present

## 2022-09-11 IMAGING — DX DG TOE GREAT 2+V*R*
3 series · 3 of 3 positions shown · non-contrast
Comparison: None

CLINICAL DATA: Fell today with pain in the great toe.

EXAM:
RIGHT GREAT TOE

[toe ap]
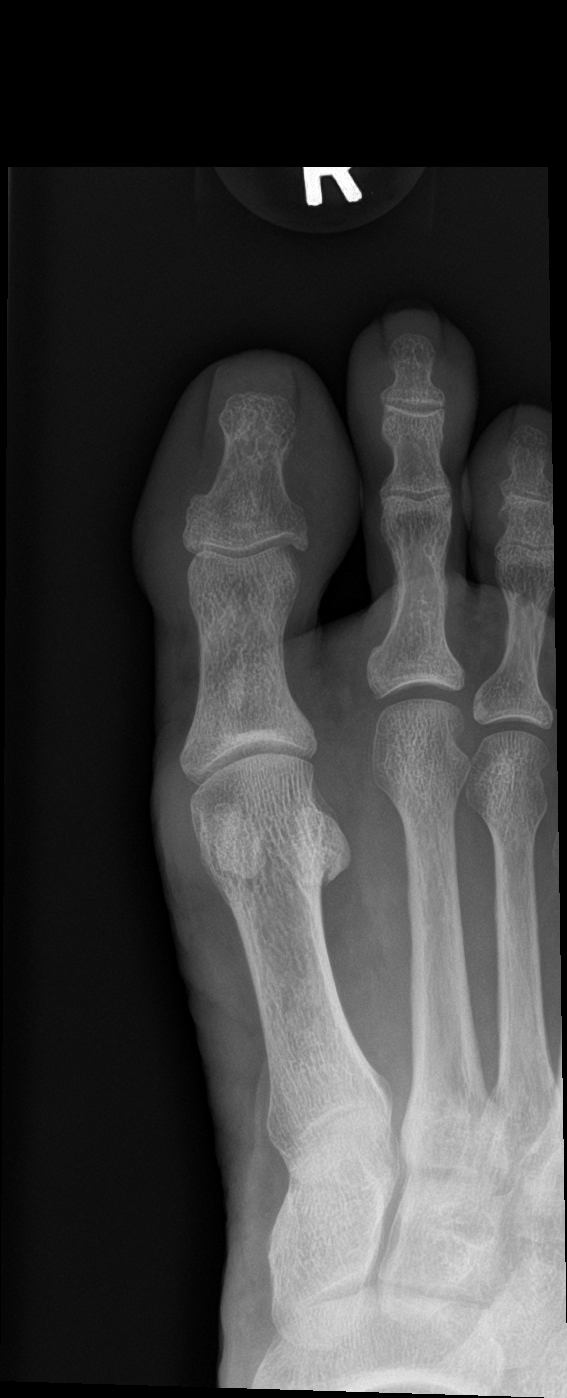

[toe obl]
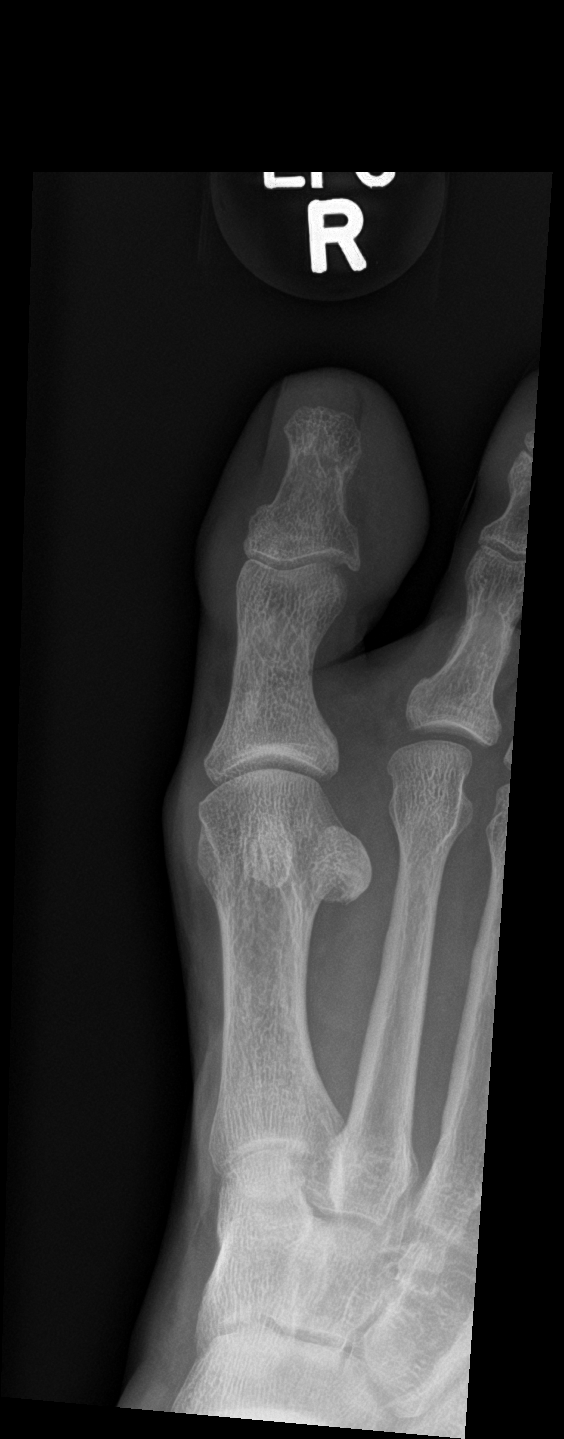

[toe lat]
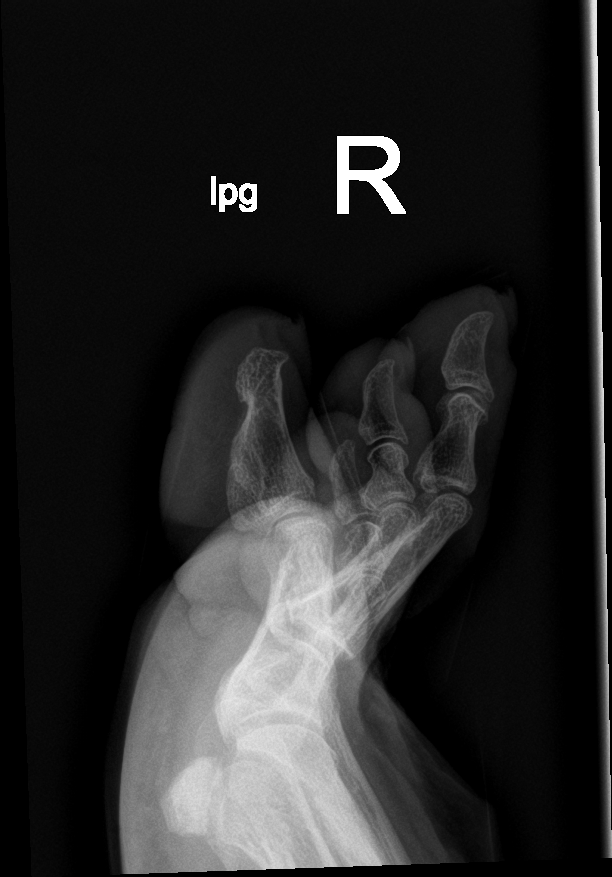

[3 of 3 positions shown; findings below may reference images not displayed]

FINDINGS: There is no evidence of fracture or dislocation. There is no
evidence of arthropathy or other focal bone abnormality. Soft
tissues are unremarkable.
IMPRESSION: Negative.

## 2022-10-15 DIAGNOSIS — H5203 Hypermetropia, bilateral: Secondary | ICD-10-CM | POA: Diagnosis not present

## 2022-10-15 DIAGNOSIS — H2513 Age-related nuclear cataract, bilateral: Secondary | ICD-10-CM | POA: Diagnosis not present

## 2022-10-15 DIAGNOSIS — H353231 Exudative age-related macular degeneration, bilateral, with active choroidal neovascularization: Secondary | ICD-10-CM | POA: Diagnosis not present

## 2022-11-06 DIAGNOSIS — N390 Urinary tract infection, site not specified: Secondary | ICD-10-CM | POA: Diagnosis not present

## 2022-11-29 ENCOUNTER — Ambulatory Visit (INDEPENDENT_AMBULATORY_CARE_PROVIDER_SITE_OTHER): Payer: Medicare HMO | Admitting: Nurse Practitioner

## 2022-11-29 ENCOUNTER — Encounter: Payer: Self-pay | Admitting: Nurse Practitioner

## 2022-11-29 ENCOUNTER — Telehealth: Payer: Self-pay

## 2022-11-29 ENCOUNTER — Other Ambulatory Visit: Payer: Self-pay

## 2022-11-29 VITALS — BP 122/78 | HR 69

## 2022-11-29 DIAGNOSIS — N644 Mastodynia: Secondary | ICD-10-CM

## 2022-11-29 DIAGNOSIS — Z9889 Other specified postprocedural states: Secondary | ICD-10-CM

## 2022-11-29 DIAGNOSIS — N6325 Unspecified lump in the left breast, overlapping quadrants: Secondary | ICD-10-CM

## 2022-11-29 DIAGNOSIS — N6320 Unspecified lump in the left breast, unspecified quadrant: Secondary | ICD-10-CM

## 2022-11-29 NOTE — Telephone Encounter (Addendum)
Orders placed for diagnostic left mammo and left breast u/s at [facility].  Appt scheduled for Monday, 12/06/22 at 1:10 pm.  Per DPR access note on file I left detailed message in her voicemail with date/time of appointment. I asked her to call me back and let me know she received the message.

## 2022-11-29 NOTE — Telephone Encounter (Signed)
Left breast diagnostic Received: Today Carolyn Mackie, NP  P [facility] Triage Please send referral for diagnostic imaging of left breast for pain and mass. Thanks.

## 2022-11-29 NOTE — Progress Notes (Signed)
   Acute Office Visit  Subjective:    Patient ID: Carolyn Wise, female    DOB: 12-29-1950, 72 y.o.   MRN: [id number]   HPI 72 y.o. presents today for left breast pain and lump x 4 weeks. Pain comes and goes, describes as achy and sore, thinks she feels a small lump about the size of a pencil eraser but having trouble finding it today. Most recent mammogram 02/2022, normal cat C breast density. Benign lumpectomy x 2 of left breast years ago. Denies nipple discharge, redness, swelling, skin changes.    Review of Systems  Constitutional: Negative.   Hematological:  Negative for adenopathy.  Right breast: Positive for pain and mass. Negative for nipple discharge, redness, swelling or skin changes.  Left breast: Negative.     Objective:    Physical Exam Constitutional:      Appearance: Normal appearance.  Chest:  Breasts:    Right: Normal.     Left: Mass (at area of lumpectomy scar) and tenderness present. No swelling, bleeding, inverted nipple, nipple discharge or skin change.       BP 122/78 (BP Location: Right Arm, Patient Position: Sitting, Cuff Size: Normal)   Pulse 69   SpO2 96%  Wt Readings from Last 3 Encounters:  03/05/21 136 lb (61.7 kg)  01/24/20 136 lb 3.2 oz (61.8 kg)  01/31/18 142 lb 1.6 oz (64.5 kg)        Patient informed chaperone available to be present for breast and/or pelvic exam. Patient has requested no chaperone to be present. Patient has been advised what will be completed during breast and pelvic exam.   Assessment & Plan:   Problem List Items Addressed This Visit   None Visit Diagnoses     Pain of left breast    -  Primary   History of left breast biopsy       Breast lump on left side at 3 o'clock position          Plan: Will send referral for diagnostic imaging of left breast.      Olivia Mackie DNP, 3:42 PM 11/29/2022

## 2022-11-30 NOTE — Telephone Encounter (Signed)
Patient callled to confirm she received my message. She may need to change the time/date and wanted to call them to discuss. I provided the phone number and prompts.

## 2022-12-06 ENCOUNTER — Other Ambulatory Visit: Payer: Medicare HMO

## 2022-12-16 ENCOUNTER — Ambulatory Visit
Admission: RE | Admit: 2022-12-16 | Discharge: 2022-12-16 | Disposition: A | Discharge status: Home / Self Care | Payer: Medicare HMO | Source: Ambulatory Visit | Attending: Nurse Practitioner | Admitting: Nurse Practitioner

## 2022-12-16 ENCOUNTER — Other Ambulatory Visit: Payer: Self-pay | Admitting: Family Medicine

## 2022-12-16 DIAGNOSIS — N644 Mastodynia: Secondary | ICD-10-CM

## 2022-12-16 DIAGNOSIS — N6320 Unspecified lump in the left breast, unspecified quadrant: Secondary | ICD-10-CM

## 2022-12-16 DIAGNOSIS — Z0000 Encounter for general adult medical examination without abnormal findings: Secondary | ICD-10-CM

## 2022-12-16 DIAGNOSIS — R92332 Mammographic heterogeneous density, left breast: Secondary | ICD-10-CM | POA: Diagnosis not present

## 2023-03-17 ENCOUNTER — Ambulatory Visit: Payer: Medicare HMO

## 2023-03-21 ENCOUNTER — Ambulatory Visit: Admission: RE | Admit: 2023-03-21 | Payer: Medicare HMO | Source: Ambulatory Visit

## 2023-03-21 DIAGNOSIS — Z0000 Encounter for general adult medical examination without abnormal findings: Secondary | ICD-10-CM

## 2023-03-21 DIAGNOSIS — Z1231 Encounter for screening mammogram for malignant neoplasm of breast: Secondary | ICD-10-CM | POA: Diagnosis not present

## 2023-03-23 ENCOUNTER — Encounter: Payer: Self-pay | Admitting: Nurse Practitioner

## 2023-03-23 ENCOUNTER — Ambulatory Visit: Payer: Medicare HMO | Admitting: Nurse Practitioner

## 2023-03-23 VITALS — BP 118/70 | HR 61 | Ht 59.84 in | Wt 141.8 lb

## 2023-03-23 DIAGNOSIS — Z01419 Encounter for gynecological examination (general) (routine) without abnormal findings: Secondary | ICD-10-CM | POA: Diagnosis not present

## 2023-03-23 DIAGNOSIS — Z780 Asymptomatic menopausal state: Secondary | ICD-10-CM

## 2023-03-23 DIAGNOSIS — M8580 Other specified disorders of bone density and structure, unspecified site: Secondary | ICD-10-CM

## 2023-03-23 NOTE — Progress Notes (Signed)
   Carolyn Wise 1950-09-18 [id number]   History:  72 y.o. MWF G2P1102 presents for breast and pelvic exam without GYN complaints. Postmenopausal - no HRT. S/P 1999 TAH. Normal pap and mammogram history. CKD3 managed by nephrology, history of basal cell and squamous cell cancer managed by derm.   Gynecologic History No LMP recorded. Patient has had a hysterectomy.   Contraception: status post hysterectomy Sexually active: Yes  Health Maintenance Last Pap: 2012 Last mammogram: 03/21/2023. Results were: Normal Last colonoscopy: 01/21/2010. Results were: Normal, 10-year recall Last Dexa: 02/18/2022. Results were: Osteopenia T score -1.5, FRAX 10% / 1.4%  Past medical history, past surgical history, family history and social history were all reviewed and documented in the EPIC chart. 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 2 children, 4 grandchildren.   ROS:  A ROS was performed and pertinent positives and negatives are included.  Exam:  Vitals:   03/23/23 1553  BP: 118/70  Pulse: 61  SpO2: 100%  Weight: 141 lb 12.8 oz (64.3 kg)  Height: 4' 11.84" (1.52 m)    Body mass index is 27.84 kg/m.  General appearance:  Normal Thyroid:  Symmetrical, normal in size, without palpable masses or nodularity. Respiratory  Auscultation:  Clear without wheezing or rhonchi Cardiovascular  Auscultation:  Regular rate, without rubs, murmurs or gallops  Edema/varicosities:  Not grossly evident Abdominal  Soft,nontender, without masses, guarding or rebound.  Liver/spleen:  No organomegaly noted  Hernia:  None appreciated  Skin  Inspection:  Grossly normal   Breasts: Examined lying and sitting.   Right: Without masses, retractions, discharge or axillary adenopathy.   Left: Without masses, retractions, discharge or axillary adenopathy. Gentitourinary   Inguinal/mons:  Normal without inguinal adenopathy  External genitalia:  Normal  BUS/Urethra/Skene's glands:   Normal  Vagina:  Normal  Cervix:  Absent  Uterus:  Absent  Anus and perineum: Normal  Digital rectal exam: Not indicated  Patient informed chaperone available to be present for breast and pelvic exam. Patient has requested no chaperone to be present. Patient has been advised what will be completed during breast and pelvic exam.   Assessment/Plan:  72 y.o. [id number] for breast and pelvic exam.   Encounter for breast and pelvic examination - Education provided on SBEs, importance of preventative screenings, current guidelines, high calcium diet, regular exercise, and multivitamin daily. Labs done elsewhere.   Postmenopausal - no HRT. S/P 1999 TAH.   Osteopenia, unspecified location - 01/2022 T-score -1.5 without elevated FRAX. Continue high calcium diet, vitamin D supplement, and regular exercise. Managed by PCP.   Screening for cervical cancer - Normal Pap history. No longer screening per guidelines.   Screening for breast cancer - Normal mammogram history.  Continue annual screenings.  Normal breast exam today.  Screening for colon cancer - 2011 colonoscopy. Will repeat at GI's recommended interval.   Return in 2 years for breast and pelvic exam.     Olivia Mackie [facility], 4:12 PM 03/23/2023

## 2023-03-30 DIAGNOSIS — I700 Atherosclerosis of aorta: Secondary | ICD-10-CM | POA: Diagnosis not present

## 2023-03-30 DIAGNOSIS — N183 Chronic kidney disease, stage 3 unspecified: Secondary | ICD-10-CM | POA: Diagnosis not present

## 2023-03-30 DIAGNOSIS — R197 Diarrhea, unspecified: Secondary | ICD-10-CM | POA: Diagnosis not present

## 2023-04-01 DIAGNOSIS — R197 Diarrhea, unspecified: Secondary | ICD-10-CM | POA: Diagnosis not present

## 2023-04-08 DIAGNOSIS — E782 Mixed hyperlipidemia: Secondary | ICD-10-CM | POA: Diagnosis not present

## 2023-04-08 DIAGNOSIS — N183 Chronic kidney disease, stage 3 unspecified: Secondary | ICD-10-CM | POA: Diagnosis not present

## 2023-04-08 DIAGNOSIS — I700 Atherosclerosis of aorta: Secondary | ICD-10-CM | POA: Diagnosis not present

## 2023-04-08 DIAGNOSIS — M8589 Other specified disorders of bone density and structure, multiple sites: Secondary | ICD-10-CM | POA: Diagnosis not present

## 2023-04-08 DIAGNOSIS — I10 Essential (primary) hypertension: Secondary | ICD-10-CM | POA: Diagnosis not present

## 2023-04-08 DIAGNOSIS — K2950 Unspecified chronic gastritis without bleeding: Secondary | ICD-10-CM | POA: Diagnosis not present

## 2023-04-08 DIAGNOSIS — K219 Gastro-esophageal reflux disease without esophagitis: Secondary | ICD-10-CM | POA: Diagnosis not present

## 2023-04-08 DIAGNOSIS — K317 Polyp of stomach and duodenum: Secondary | ICD-10-CM | POA: Diagnosis not present

## 2023-04-08 DIAGNOSIS — R197 Diarrhea, unspecified: Secondary | ICD-10-CM | POA: Diagnosis not present

## 2023-04-08 DIAGNOSIS — K824 Cholesterolosis of gallbladder: Secondary | ICD-10-CM | POA: Diagnosis not present

## 2023-04-08 DIAGNOSIS — N3941 Urge incontinence: Secondary | ICD-10-CM | POA: Diagnosis not present

## 2023-04-08 DIAGNOSIS — Z0000 Encounter for general adult medical examination without abnormal findings: Secondary | ICD-10-CM | POA: Diagnosis not present

## 2023-04-27 DIAGNOSIS — Z23 Encounter for immunization: Secondary | ICD-10-CM | POA: Diagnosis not present

## 2023-05-19 DIAGNOSIS — E782 Mixed hyperlipidemia: Secondary | ICD-10-CM | POA: Diagnosis not present

## 2023-05-19 DIAGNOSIS — Z79899 Other long term (current) drug therapy: Secondary | ICD-10-CM | POA: Diagnosis not present

## 2023-06-13 DIAGNOSIS — N183 Chronic kidney disease, stage 3 unspecified: Secondary | ICD-10-CM | POA: Diagnosis not present

## 2023-06-13 DIAGNOSIS — I129 Hypertensive chronic kidney disease with stage 1 through stage 4 chronic kidney disease, or unspecified chronic kidney disease: Secondary | ICD-10-CM | POA: Diagnosis not present

## 2023-06-16 DIAGNOSIS — D631 Anemia in chronic kidney disease: Secondary | ICD-10-CM | POA: Diagnosis not present

## 2023-06-16 DIAGNOSIS — R8281 Pyuria: Secondary | ICD-10-CM | POA: Diagnosis not present

## 2023-06-16 DIAGNOSIS — M898X9 Other specified disorders of bone, unspecified site: Secondary | ICD-10-CM | POA: Diagnosis not present

## 2023-06-16 DIAGNOSIS — I129 Hypertensive chronic kidney disease with stage 1 through stage 4 chronic kidney disease, or unspecified chronic kidney disease: Secondary | ICD-10-CM | POA: Diagnosis not present

## 2023-06-16 DIAGNOSIS — E559 Vitamin D deficiency, unspecified: Secondary | ICD-10-CM | POA: Diagnosis not present

## 2023-06-16 DIAGNOSIS — N1831 Chronic kidney disease, stage 3a: Secondary | ICD-10-CM | POA: Diagnosis not present

## 2023-06-17 DIAGNOSIS — K219 Gastro-esophageal reflux disease without esophagitis: Secondary | ICD-10-CM | POA: Diagnosis not present

## 2023-06-17 DIAGNOSIS — R1310 Dysphagia, unspecified: Secondary | ICD-10-CM | POA: Diagnosis not present

## 2023-06-17 DIAGNOSIS — Z1211 Encounter for screening for malignant neoplasm of colon: Secondary | ICD-10-CM | POA: Diagnosis not present

## 2023-06-17 DIAGNOSIS — R197 Diarrhea, unspecified: Secondary | ICD-10-CM | POA: Diagnosis not present

## 2023-09-01 DIAGNOSIS — L570 Actinic keratosis: Secondary | ICD-10-CM | POA: Diagnosis not present

## 2023-09-01 DIAGNOSIS — C44519 Basal cell carcinoma of skin of other part of trunk: Secondary | ICD-10-CM | POA: Diagnosis not present

## 2023-09-01 DIAGNOSIS — L814 Other melanin hyperpigmentation: Secondary | ICD-10-CM | POA: Diagnosis not present

## 2023-09-01 DIAGNOSIS — D1801 Hemangioma of skin and subcutaneous tissue: Secondary | ICD-10-CM | POA: Diagnosis not present

## 2023-09-01 DIAGNOSIS — L821 Other seborrheic keratosis: Secondary | ICD-10-CM | POA: Diagnosis not present

## 2023-09-01 DIAGNOSIS — D0461 Carcinoma in situ of skin of right upper limb, including shoulder: Secondary | ICD-10-CM | POA: Diagnosis not present

## 2023-09-01 DIAGNOSIS — Z85828 Personal history of other malignant neoplasm of skin: Secondary | ICD-10-CM | POA: Diagnosis not present

## 2023-09-01 DIAGNOSIS — D485 Neoplasm of uncertain behavior of skin: Secondary | ICD-10-CM | POA: Diagnosis not present

## 2023-09-01 DIAGNOSIS — B078 Other viral warts: Secondary | ICD-10-CM | POA: Diagnosis not present

## 2023-09-29 DIAGNOSIS — R1310 Dysphagia, unspecified: Secondary | ICD-10-CM | POA: Diagnosis not present

## 2023-09-29 DIAGNOSIS — Z1211 Encounter for screening for malignant neoplasm of colon: Secondary | ICD-10-CM | POA: Diagnosis not present

## 2023-09-29 DIAGNOSIS — K219 Gastro-esophageal reflux disease without esophagitis: Secondary | ICD-10-CM | POA: Diagnosis not present

## 2023-09-29 DIAGNOSIS — R197 Diarrhea, unspecified: Secondary | ICD-10-CM | POA: Diagnosis not present

## 2023-10-14 DIAGNOSIS — L989 Disorder of the skin and subcutaneous tissue, unspecified: Secondary | ICD-10-CM | POA: Diagnosis not present

## 2023-10-14 DIAGNOSIS — I10 Essential (primary) hypertension: Secondary | ICD-10-CM | POA: Diagnosis not present

## 2023-10-14 DIAGNOSIS — M7130 Other bursal cyst, unspecified site: Secondary | ICD-10-CM | POA: Diagnosis not present

## 2023-10-14 DIAGNOSIS — Z820 Family history of epilepsy and other diseases of the nervous system: Secondary | ICD-10-CM | POA: Diagnosis not present

## 2023-10-14 DIAGNOSIS — R040 Epistaxis: Secondary | ICD-10-CM | POA: Diagnosis not present

## 2023-10-14 DIAGNOSIS — R319 Hematuria, unspecified: Secondary | ICD-10-CM | POA: Diagnosis not present

## 2023-10-14 DIAGNOSIS — N1831 Chronic kidney disease, stage 3a: Secondary | ICD-10-CM | POA: Diagnosis not present

## 2023-10-17 ENCOUNTER — Encounter: Payer: Self-pay | Admitting: Physician Assistant

## 2023-10-17 DIAGNOSIS — N390 Urinary tract infection, site not specified: Secondary | ICD-10-CM | POA: Diagnosis not present

## 2023-10-20 DIAGNOSIS — I788 Other diseases of capillaries: Secondary | ICD-10-CM | POA: Diagnosis not present

## 2023-10-20 DIAGNOSIS — L905 Scar conditions and fibrosis of skin: Secondary | ICD-10-CM | POA: Diagnosis not present

## 2023-10-20 DIAGNOSIS — L570 Actinic keratosis: Secondary | ICD-10-CM | POA: Diagnosis not present

## 2023-10-20 DIAGNOSIS — Z85828 Personal history of other malignant neoplasm of skin: Secondary | ICD-10-CM | POA: Diagnosis not present

## 2023-10-21 DIAGNOSIS — H5203 Hypermetropia, bilateral: Secondary | ICD-10-CM | POA: Diagnosis not present

## 2023-10-21 DIAGNOSIS — H353132 Nonexudative age-related macular degeneration, bilateral, intermediate dry stage: Secondary | ICD-10-CM | POA: Diagnosis not present

## 2023-10-21 DIAGNOSIS — H2513 Age-related nuclear cataract, bilateral: Secondary | ICD-10-CM | POA: Diagnosis not present

## 2023-11-07 DIAGNOSIS — N390 Urinary tract infection, site not specified: Secondary | ICD-10-CM | POA: Diagnosis not present

## 2023-11-12 NOTE — Progress Notes (Signed)
 Assessment/Plan:   Carolyn Wise is a very pleasant 73 y.o. year old RH female with a history of hypertension, hyperlipidemia, vit D deficiency, CKD, GERD,  strong family history of AD, seen today for evaluation of memory loss. MoCA today is 29/30.  Etiology is unclear at this time but workup is in progress.  Patient is able to participate on her IADLs, still working as a Runner, broadcasting/film/video, and continues to drive without significant difficulties.     Memory Impairment of unclear etiology  MRI brain without contrast to assess for underlying structural abnormality and assess vascular load.  Neurocognitive testing to further evaluate cognitive concerns and determine other underlying cause of memory changes, including potential contribution from sleep, anxiety, attention, or depression  Recommend checking hearing on a regular basis.  Check B12, TSH. Will consider biomarkers on the next visit. Continue to control mood as per PCP Recommend good control of cardiovascular risk factors [place] up in May 23 at  9 am    Subjective:   The patient is here alone   How long did patient have memory difficulties? For the last 9 months.She is concerned due to strong family history of AD"Some days I don't remember if I ate dinner or if I had breakfast". Reports some difficulty remembering new information, conversations and names.  Long-term memory is good. She is trying TMS and red light . repeats oneself?  "Not really" Disoriented when walking into a room?  Patient denies except occasionally not remembering what patient came to the room for    Leaving objects in unusual places? Denies.   Wandering behavior?  denies . Any personality changes?  Denies.   Any history of depression?:  Denies. Does have some stress in her life regarding  some construction at home and  her niece, who "has brain damage. She had been her caregiver but she is now on a Nursing Home".    Hallucinations or paranoia?  Denies   Seizures?   Denies.    Any sleep changes? Does not sleep well "my brain sings songs all night long", tried melatonin without help. Denies vivid dreams, REM behavior or sleepwalking   Sleep apnea?  Denies   Any hygiene concerns?  Denies   Independent of bathing and dressing?  Endorsed  Does the patient needs help with medications? Patient is in charge   Who is in charge of the finances? Patient is in charge     Any changes in appetite?  Denies, tries to eat health healthy, drinks plenty water.      Patient have trouble swallowing? None after esophageal dilatation Does the patient cook? Not much , husband does the cooking Any kitchen accidents such as leaving the stove on? Denies.   Any history of headaches?   Denies.   Chronic pain ? Denies.   Ambulates with difficulty? Works out daily, walks up to 2 miles daily.  Recent falls or head injuries? Denies. 2 years ago, hitting on concrete on the frontal area, and another mechanical fall a month later missing the step without head injury, no LOC in either fall. Vision changes?  Having cataract surgery this summer  Unilateral weakness, numbness or tingling? Denies.   Any tremors?  Denies.   Any anosmia?  Denies.   Any incontinence of urine? Urge incontinence. Wears pads.   Any bowel dysfunction? "Because of stress had severe diarrhea, not it is intermittent". Had negative studies. Sees GI Patient lives with her husband     History of  heavy alcohol intake? Denies.   History of heavy tobacco use? Denies.   Family history of dementia? 3 maternal aunts  and mother with AD, one aunt with LBD.  Does patient drive? Yes, denies getting lost, drives mostly during the day due to cataracts    Labs 09/2023: n; LFT. Nl lipid panel.    Past Medical History:  Diagnosis Date   Allergy    Breast fibrocystic disorder    Fibroid    Hypertension    Kidney disease, chronic, stage III (GFR 30-59 ml/min) (HCC) 2015   Osteopenia      Past Surgical History:  Procedure  Laterality Date   ABDOMINAL HYSTERECTOMY  1999   TAH   Artery repair surg  1981   BREAST EXCISIONAL BIOPSY Left 1979   BREAST EXCISIONAL BIOPSY Left    BREAST SURGERY     Biopsy--Fibroadenoma   CHOLECYSTECTOMY  05/2022   SKIN CANCER EXCISION  03/2019   SKIN CANCER ON HEAD   TONSILLECTOMY AND ADENOIDECTOMY     TUBAL LIGATION       Allergies  Allergen Reactions   Sulfonamide Derivatives Rash    Current Outpatient Medications  Medication Instructions   Calcium Carbonate-Vitamin D (CALCIUM-VITAMIN D) 600-125 MG-UNIT TABS Daily   famotidine (PEPCID) 20 MG tablet Take by mouth.   lisinopril (PRINIVIL,ZESTRIL) 10 MG tablet 1 tablet, Daily   MAGNESIUM PO Take by mouth.   Multiple Vitamin (MULTIVITAMIN) capsule 1 capsule, Daily   Omega-3 Fatty Acids (FISH OIL OMEGA-3 PO) Take by mouth. 1 PO DAILY   pravastatin (PRAVACHOL) 20 MG tablet Take by mouth.   Probiotic Product (PROBIOTIC PO) Take by mouth.   Vitamin D3 2,000 Units, Oral, Daily     VITALS:   Vitals:   11/17/23 0754  BP: 139/68  Pulse: 62  Resp: 18  SpO2: 95%  Weight: 135 lb (61.2 kg)  Height: 4\' 11"  (1.499 m)      PHYSICAL EXAM   HEENT:  Normocephalic, atraumatic. The superficial temporal arteries are without ropiness or tenderness. Cardiovascular: Regular rate and rhythm. Lungs: Clear to auscultation bilaterally. Neck: There are no carotid bruits noted bilaterally.  NEUROLOGICAL:    11/17/2023    9:00 AM  Montreal Cognitive Assessment   Visuospatial/ Executive (0/5) 5  Naming (0/3) 3  Attention: Read list of digits (0/2) 2  Attention: Read list of letters (0/1) 1  Attention: Serial 7 subtraction starting at 100 (0/3) 3  Language: Repeat phrase (0/2) 2  Language : Fluency (0/1) 1  Abstraction (0/2) 1  Delayed Recall (0/5) 5  Orientation (0/6) 6  Total 29  Adjusted Score (based on education) 29        No data to display           Orientation:  Alert and oriented to person, place and time. No  aphasia or dysarthria. Fund of knowledge is appropriate. Recent memory impaired and remote memory intact.  Attention and concentration are normal.  Able to name objects and repeat phrases. Delayed recall  4/5 Cranial nerves: There is good facial symmetry. Extraocular muscles are intact and visual fields are full to confrontational testing. Speech is fluent and clear. No tongue deviation. Hearing is decreased on the R to conversational tone. Tone: Tone is good throughout. Sensation: Sensation is intact to light touch. Vibration is intact at the bilateral big toe.  Coordination: The patient has no difficulty with RAM's or FNF bilaterally. Normal finger to nose  Motor: Strength is 5/5 in the bilateral  upper and lower extremities. There is no pronator drift. There are no fasciculations noted. DTR's: Deep tendon reflexes are 2/4 bilaterally. Gait and Station: The patient is able to ambulate without difficulty The patient is able to heel toe walk . Gait is cautious and narrow. The patient is able to ambulate in a tandem fashion.       Thank you for allowing us  the opportunity to participate in the care of this nice patient. Please do not hesitate to contact us  for any questions or concerns.   Total time spent on today's visit was 60 minutes dedicated to this patient today, preparing to see patient, examining the patient, ordering tests and/or medications and counseling the patient, documenting clinical information in the EHR or other health record, independently interpreting results and communicating results to the patient/family, discussing treatment and goals, answering patient's questions and coordinating care.  Cc:  Ruven Coy, MD  Tex Filbert 11/17/2023 9:09 AM

## 2023-11-17 ENCOUNTER — Ambulatory Visit: Admitting: Physician Assistant

## 2023-11-17 ENCOUNTER — Encounter: Payer: Self-pay | Admitting: Physician Assistant

## 2023-11-17 ENCOUNTER — Ambulatory Visit

## 2023-11-17 ENCOUNTER — Other Ambulatory Visit

## 2023-11-17 VITALS — BP 139/68 | HR 62 | Resp 18 | Ht 59.0 in | Wt 135.0 lb

## 2023-11-17 DIAGNOSIS — R413 Other amnesia: Secondary | ICD-10-CM | POA: Insufficient documentation

## 2023-11-17 NOTE — Patient Instructions (Addendum)
 It was a pleasure to see you today at our office.   Recommendations:  Neurocognitive evaluation at our office   MRI of the brain, the radiology office will call you to arrange you appointment  [phone] Check labs today  [address] Follow up May 23 at 9 am     For psychiatric meds, mood meds: Please have your primary care physician manage these medications.  If you have any severe symptoms of a stroke, or other severe issues such as confusion,severe chills or fever, etc call 911 or go to the ER as you may need to be evaluated further    For assessment of decision of mental capacity and competency:  Call Dr. Laverne Potter, geriatric psychiatrist at [phone]    Whom to call: Memory  decline, memory medications: Call our office [phone]    [email or web address]   RECOMMENDATIONS FOR ALL PATIENTS WITH MEMORY PROBLEMS: 1. Continue to exercise (Recommend 30 minutes of walking everyday, or 3 hours every week) 2. Increase social interactions - continue going to [place] and enjoy social gatherings with friends and family 3. Eat healthy, avoid fried foods and eat more fruits and vegetables 4. Maintain adequate blood pressure, blood sugar, and blood cholesterol level. Reducing the risk of stroke and cardiovascular disease also helps promoting better memory. 5. Avoid stressful situations. Live a simple life and avoid aggravations. Organize your time and prepare for the next day in anticipation. 6. Sleep well, avoid any interruptions of sleep and avoid any distractions in the bedroom that may interfere with adequate sleep quality 7. Avoid sugar, avoid sweets as there is a strong link between excessive sugar intake, diabetes, and cognitive impairment We discussed the Mediterranean diet, which has been shown to help patients reduce the risk of progressive memory disorders and reduces cardiovascular risk. This includes eating fish, eat  fruits and green leafy vegetables, nuts like almonds and hazelnuts, walnuts, and also use olive oil. Avoid fast foods and fried foods as much as possible. Avoid sweets and sugar as sugar use has been linked to worsening of memory function.  There is always a concern of gradual progression of memory problems. If this is the case, then we may need to adjust level of care according to patient needs. Support, both to the patient and caregiver, should then be put into place.      You have been referred for a neuropsychological evaluation (i.e., evaluation of memory and thinking abilities). Please bring someone with you to this appointment if possible, as it is helpful for the doctor to hear from both you and another adult who knows you well. Please bring eyeglasses and hearing aids if you wear them.    The evaluation will take approximately 3 hours and has two parts:   The first part is a clinical interview with the neuropsychologist (Dr. Kitty Perkins or Dr. Donavon Fudge). During the interview, the neuropsychologist will speak with you and the individual you brought to the appointment.    The second part of the evaluation is testing with the doctor's technician Bernabe Brew or Burdette Carolin). During the testing, the technician will ask you to remember different types of material, solve problems, and answer some questionnaires. Your family member will not be present for this portion of the evaluation.   Please note: We must reserve several hours of the neuropsychologist's time and the psychometrician's time for your evaluation appointment. As such, there is a No-Show fee of $100. If you are unable to attend any of your appointments, please contact our  office as soon as possible to reschedule.      DRIVING: Regarding driving, in patients with progressive memory problems, driving will be impaired. We advise to have someone else do the driving if trouble finding directions or if minor accidents are reported. Independent driving  assessment is available to determine safety of driving.   If you are interested in the driving assessment, you can contact the following:  [facility] [employer] in [place] [phone]  [facility] [phone]  [facility] [phone]  [facility] [phone] or [phone]   FALL PRECAUTIONS: Be cautious when walking. Scan the area for obstacles that may increase the risk of trips and falls. When getting up in the mornings, sit up at the edge of the bed for a few minutes before getting out of bed. Consider elevating the bed at the head end to avoid drop of blood pressure when getting up. Walk always in a well-lit room (use night lights in the walls). Avoid area rugs or power cords from appliances in the middle of the walkways. Use a walker or a cane if necessary and consider physical therapy for balance exercise. Get your eyesight checked regularly.  FINANCIAL OVERSIGHT: Supervision, especially oversight when making financial decisions or transactions is also recommended.  HOME SAFETY: Consider the safety of the kitchen when operating appliances like stoves, microwave oven, and blender. Consider having supervision and share cooking responsibilities until no longer able to participate in those. Accidents with firearms and other hazards in the house should be identified and addressed as well.   ABILITY TO BE LEFT ALONE: If patient is unable to contact 911 operator, consider using LifeLine, or when the need is there, arrange for someone to stay with patients. Smoking is a fire hazard, consider supervision or cessation. Risk of wandering should be assessed by caregiver and if detected at any point, supervision and safe proof recommendations should be instituted.  MEDICATION SUPERVISION: Inability to self-administer medication needs to be constantly addressed. Implement a mechanism to ensure safe administration of the  medications.      Mediterranean Diet A Mediterranean diet refers to food and lifestyle choices that are based on the traditions of countries located on the [employer]. This way of eating has been shown to help prevent certain conditions and improve outcomes for people who have chronic diseases, like kidney disease and heart disease. What are tips for following this plan? Lifestyle  Cook and eat meals together with your family, when possible. Drink enough fluid to keep your urine clear or pale yellow. Be physically active every day. This includes: Aerobic exercise like running or swimming. Leisure activities like gardening, walking, or housework. Get 7-8 hours of sleep each night. If recommended by your health care provider, drink red wine in moderation. This means 1 glass a day for nonpregnant women and 2 glasses a day for men. A glass of wine equals 5 oz (150 mL). Reading food labels  Check the serving size of packaged foods. For foods such as rice and pasta, the serving size refers to the amount of cooked product, not dry. Check the total fat in packaged foods. Avoid foods that have saturated fat or trans fats. Check the ingredients list for added sugars, such as corn syrup. Shopping  At the grocery store, buy most of your food from the areas near the walls of the store. This includes: Fresh fruits and vegetables (produce). Grains, beans, nuts, and seeds. Some of these may be available in unpackaged forms or  large amounts (in bulk). Fresh seafood. Poultry and eggs. Low-fat dairy products. Buy whole ingredients instead of prepackaged foods. Buy fresh fruits and vegetables in-season from local farmers markets. Buy frozen fruits and vegetables in resealable bags. If you do not have access to quality fresh seafood, buy precooked frozen shrimp or canned fish, such as tuna, salmon, or sardines. Buy small amounts of raw or cooked vegetables, salads, or olives from the deli or salad  bar at your store. Stock your pantry so you always have certain foods on hand, such as olive oil, canned tuna, canned tomatoes, rice, pasta, and beans. Cooking  Cook foods with extra-virgin olive oil instead of using butter or other vegetable oils. Have meat as a side dish, and have vegetables or grains as your main dish. This means having meat in small portions or adding small amounts of meat to foods like pasta or stew. Use beans or vegetables instead of meat in common dishes like chili or lasagna. Experiment with different cooking methods. Try roasting or broiling vegetables instead of steaming or sauteing them. Add frozen vegetables to soups, stews, pasta, or rice. Add nuts or seeds for added healthy fat at each meal. You can add these to yogurt, salads, or vegetable dishes. Marinate fish or vegetables using olive oil, lemon juice, garlic, and fresh herbs. Meal planning  Plan to eat 1 vegetarian meal one day each week. Try to work up to 2 vegetarian meals, if possible. Eat seafood 2 or more times a week. Have healthy snacks readily available, such as: Vegetable sticks with hummus. Greek yogurt. Fruit and nut trail mix. Eat balanced meals throughout the week. This includes: Fruit: 2-3 servings a day Vegetables: 4-5 servings a day Low-fat dairy: 2 servings a day Fish, poultry, or lean meat: 1 serving a day Beans and legumes: 2 or more servings a week Nuts and seeds: 1-2 servings a day Whole grains: 6-8 servings a day Extra-virgin olive oil: 3-4 servings a day Limit red meat and sweets to only a few servings a month What are my food choices? Mediterranean diet Recommended Grains: Whole-grain pasta. Brown rice. Bulgar wheat. Polenta. Couscous. Whole-wheat bread. Dwyane Glad. Vegetables: Artichokes. Beets. Broccoli. Cabbage. Carrots. Eggplant. Green beans. Chard. Kale. Spinach. Onions. Leeks. Peas. Squash. Tomatoes. Peppers. Radishes. Fruits: Apples. Apricots. Avocado. Berries.  Bananas. Cherries. Dates. Figs. Grapes. Lemons. Melon. Oranges. Peaches. Plums. Pomegranate. Meats and other protein foods: Beans. Almonds. Sunflower seeds. Pine nuts. Peanuts. Cod. Salmon. Scallops. Shrimp. Tuna. Tilapia. Clams. Oysters. Eggs. Dairy: Low-fat milk. Cheese. Greek yogurt. Beverages: Water. Red wine. Herbal tea. Fats and oils: Extra virgin olive oil. Avocado oil. Grape seed oil. Sweets and desserts: [place] yogurt with honey. Baked apples. Poached pears. Trail mix. Seasoning and other foods: Basil. Cilantro. Coriander. Cumin. Mint. Parsley. Sage. Rosemary. Tarragon. Garlic. Oregano. Thyme. Pepper. Balsalmic vinegar. Tahini. Hummus. Tomato sauce. Olives. Mushrooms. Limit these Grains: Prepackaged pasta or rice dishes. Prepackaged cereal with added sugar. Vegetables: Deep fried potatoes (french fries). Fruits: Fruit canned in syrup. Meats and other protein foods: Beef. Pork. Lamb. Poultry with skin. Hot dogs. Helene Loader. Dairy: Ice cream. Sour cream. Whole milk. Beverages: Juice. Sugar-sweetened soft drinks. Beer. Liquor and spirits. Fats and oils: Butter. Canola oil. Vegetable oil. Beef fat (tallow). Lard. Sweets and desserts: Cookies. Cakes. Pies. Candy. Seasoning and other foods: Mayonnaise. Premade sauces and marinades. The items listed may not be a complete list. Talk with your dietitian about what dietary choices are right for you. Summary The Mediterranean diet includes both food  and lifestyle choices. Eat a variety of fresh fruits and vegetables, beans, nuts, seeds, and whole grains. Limit the amount of red meat and sweets that you eat. Talk with your health care provider about whether it is safe for you to drink red wine in moderation. This means 1 glass a day for nonpregnant women and 2 glasses a day for men. A glass of wine equals 5 oz (150 mL). This information is not intended to replace advice given to you by your health care provider. Make sure you discuss any questions you  have with your health care provider. Document Released: 03/04/2016 Document Revised: 04/06/2016 Document Reviewed: 03/04/2016 Elsevier Interactive Patient Education  2017 [employer].

## 2023-11-18 ENCOUNTER — Encounter: Payer: Self-pay | Admitting: Physician Assistant

## 2023-11-18 LAB — VITAMIN B12: Vitamin B-12: 647 pg/mL (ref 200–1100)

## 2023-11-18 LAB — TSH: TSH: 1.91 m[IU]/L (ref 0.40–4.50)

## 2023-11-18 NOTE — Progress Notes (Signed)
 Thyroid  levels and B12 are normal. Thanks

## 2023-12-01 ENCOUNTER — Ambulatory Visit
Admission: RE | Admit: 2023-12-01 | Discharge: 2023-12-01 | Disposition: A | Discharge status: Home / Self Care | Source: Ambulatory Visit | Attending: Physician Assistant | Admitting: Physician Assistant

## 2023-12-01 DIAGNOSIS — R413 Other amnesia: Secondary | ICD-10-CM | POA: Diagnosis not present

## 2023-12-02 NOTE — Progress Notes (Signed)
 The MRI of the brain is normal for age. No strokes, masses or bleeding. Thanks

## 2023-12-16 ENCOUNTER — Ambulatory Visit: Admitting: Physician Assistant

## 2023-12-16 ENCOUNTER — Encounter: Payer: Self-pay | Admitting: Physician Assistant

## 2023-12-16 ENCOUNTER — Other Ambulatory Visit

## 2023-12-16 VITALS — BP 112/63 | HR 68 | Resp 18 | Ht 59.0 in | Wt 135.0 lb

## 2023-12-16 DIAGNOSIS — R413 Other amnesia: Secondary | ICD-10-CM | POA: Diagnosis not present

## 2023-12-16 NOTE — Progress Notes (Signed)
 Assessment/Plan:   Memory concerns of unclear etiology  Carolyn Wise is a very pleasant 73 y.o. RH female with a history of hypertension, hyperlipidemia, vit D deficiency, CKD, GERD, strong family history of AD seen today in follow up to discuss the MRI of the brain results performed on 12/01/2023 . These were personally reviewed, remarkable for mild chronic small vessel ischemic disease, no age advanced or lobar predominant cerebral atrophy.  No acute findings were noted. Given her strong family history of AD, LP was discussed, patient declines.We discussed biomarkers to help us  in search for probability of the disease, she agrees to proceed. She is also doing  TMS and Red Light therapy, stating that her memory issues are improving with it.  She is considering relaxation techniques to help with concentration,etc (read the [facility] book) . Patient is here alone. Patient was first seen on 11/17/2023 with a MoCA of  29/30.   Antidementia medication is not indicated at this time. Patient is able to participate on ADLs and to drive without difficulties.     Follow up in 6  months. Check biomarkers Continue to control mood as per PCP Recommend good control of cardiovascular risk factors  Initial visit 11/17/2023 How long did patient have memory difficulties? For the last 9 months.She is concerned due to strong family history of AD"Some days I don't remember if I ate dinner or if I had breakfast". Reports some difficulty remembering new information, conversations and names.  Long-term memory is good. She is trying TMS and red light . repeats oneself?  "Not really" Disoriented when walking into a room?  Patient denies except occasionally not remembering what patient came to the room for    Leaving objects in unusual places? Denies.   Wandering behavior?  denies . Any personality changes?  Denies.   Any history of depression?:  Denies. Does have some stress in her life regarding  some construction at  home and  her niece, who "has brain damage. She had been her caregiver but she is now on a Nursing Home".    Hallucinations or paranoia?  Denies   Seizures?  Denies.    Any sleep changes? Does not sleep well "my brain sings songs all night long", tried melatonin without help. Denies vivid dreams, REM behavior or sleepwalking   Sleep apnea?  Denies   Any hygiene concerns?  Denies   Independent of bathing and dressing?  Endorsed  Does the patient needs help with medications? Patient is in charge   Who is in charge of the finances? Patient is in charge     Any changes in appetite?  Denies, tries to eat health healthy, drinks plenty water.      Patient have trouble swallowing? None after esophageal dilatation Does the patient cook? Not much , husband does the cooking Any kitchen accidents such as leaving the stove on? Denies.   Any history of headaches?   Denies.   Chronic pain ? Denies.   Ambulates with difficulty? Works out daily, walks up to 2 miles daily.  Recent falls or head injuries? Denies. 2 years ago, hitting on concrete on the frontal area, and another mechanical fall a month later missing the step without head injury, no LOC in either fall. Vision changes?  Having cataract surgery this summer  Unilateral weakness, numbness or tingling? Denies.   Any tremors?  Denies.   Any anosmia?  Denies.   Any incontinence of urine? Urge incontinence. Wears pads.   Any  bowel dysfunction? "Because of stress had severe diarrhea, not it is intermittent". Had negative studies. Sees GI Patient lives with her husband     History of heavy alcohol intake? Denies.   History of heavy tobacco use? Denies.   Family history of dementia? 3 maternal aunts  and mother with AD, one aunt with LBD.  Does patient drive? Yes, denies getting lost, drives mostly during the day due to cataracts    MRI of the brain greater than 12/01/2023, personally reviewed, negative for acute intracranial abnormalities, there is no  age advanced or lobar predominant cerebral atrophy, moderate mild chronic small vessel ischemic changes within the cerebral white matter.  CURRENT MEDICATIONS:  Outpatient Encounter Medications as of 12/16/2023  Medication Sig   Calcium Carbonate-Vitamin D (CALCIUM-VITAMIN D) 600-125 MG-UNIT TABS Take by mouth daily.     Cholecalciferol (VITAMIN D3) 50 MCG (2000 UT) capsule Take 2,000 Units by mouth daily.   famotidine (PEPCID) 20 MG tablet Take by mouth.   lisinopril (PRINIVIL,ZESTRIL) 10 MG tablet Take 1 tablet by mouth daily.   MAGNESIUM PO Take by mouth.   Multiple Vitamin (MULTIVITAMIN) capsule Take 1 capsule by mouth daily.   Omega-3 Fatty Acids (FISH OIL OMEGA-3 PO) Take by mouth. 1 PO DAILY   pravastatin (PRAVACHOL) 20 MG tablet Take by mouth.   Probiotic Product (PROBIOTIC PO) Take by mouth.   No facility-administered encounter medications on file as of 12/16/2023.        No data to display            11/17/2023    9:00 AM  Montreal Cognitive Assessment   Visuospatial/ Executive (0/5) 5  Naming (0/3) 3  Attention: Read list of digits (0/2) 2  Attention: Read list of letters (0/1) 1  Attention: Serial 7 subtraction starting at 100 (0/3) 3  Language: Repeat phrase (0/2) 2  Language : Fluency (0/1) 1  Abstraction (0/2) 1  Delayed Recall (0/5) 5  Orientation (0/6) 6  Total 29  Adjusted Score (based on education) 29   Thank you for allowing us  the opportunity to participate in the care of this nice patient. Please do not hesitate to contact us  for any questions or concerns.   Total time spent on today's visit was 36 minutes dedicated to this patient today, preparing to see patient, examining the patient, ordering tests and/or medications and counseling the patient, documenting clinical information in the EHR or other health record, independently interpreting results and communicating results to the patient/family, discussing treatment and goals, answering patient's  questions and coordinating care.  Cc:  Ruven Coy, MD  Tex Filbert 12/16/2023 5:32 AM

## 2023-12-16 NOTE — Patient Instructions (Signed)
 It was a pleasure to see you today at our office.   Recommendations:  Follow up  pending on the results of the biomarkers  Biomarkers      For psychiatric meds, mood meds: Please have your primary care physician manage these medications.  If you have any severe symptoms of a stroke, or other severe issues such as confusion,severe chills or fever, etc call 911 or go to the ER as you may need to be evaluated further    For assessment of decision of mental capacity and competency:  Call Dr. Laverne Potter, geriatric psychiatrist at [phone]    Whom to call: Memory  decline, memory medications: Call our office [phone]    [email or web address]   RECOMMENDATIONS FOR ALL PATIENTS WITH MEMORY PROBLEMS: 1. Continue to exercise (Recommend 30 minutes of walking everyday, or 3 hours every week) 2. Increase social interactions - continue going to [place] and enjoy social gatherings with friends and family 3. Eat healthy, avoid fried foods and eat more fruits and vegetables 4. Maintain adequate blood pressure, blood sugar, and blood cholesterol level. Reducing the risk of stroke and cardiovascular disease also helps promoting better memory. 5. Avoid stressful situations. Live a simple life and avoid aggravations. Organize your time and prepare for the next day in anticipation. 6. Sleep well, avoid any interruptions of sleep and avoid any distractions in the bedroom that may interfere with adequate sleep quality 7. Avoid sugar, avoid sweets as there is a strong link between excessive sugar intake, diabetes, and cognitive impairment We discussed the Mediterranean diet, which has been shown to help patients reduce the risk of progressive memory disorders and reduces cardiovascular risk. This includes eating fish, eat fruits and green leafy vegetables, nuts like almonds and hazelnuts, walnuts, and also use olive oil. Avoid fast foods and fried  foods as much as possible. Avoid sweets and sugar as sugar use has been linked to worsening of memory function.  There is always a concern of gradual progression of memory problems. If this is the case, then we may need to adjust level of care according to patient needs. Support, both to the patient and caregiver, should then be put into place.      You have been referred for a neuropsychological evaluation (i.e., evaluation of memory and thinking abilities). Please bring someone with you to this appointment if possible, as it is helpful for the doctor to hear from both you and another adult who knows you well. Please bring eyeglasses and hearing aids if you wear them.    The evaluation will take approximately 3 hours and has two parts:   The first part is a clinical interview with the neuropsychologist (Dr. Kitty Perkins or Dr. Donavon Fudge). During the interview, the neuropsychologist will speak with you and the individual you brought to the appointment.    The second part of the evaluation is testing with the doctor's technician Bernabe Brew or Burdette Carolin). During the testing, the technician will ask you to remember different types of material, solve problems, and answer some questionnaires. Your family member will not be present for this portion of the evaluation.   Please note: We must reserve several hours of the neuropsychologist's time and the psychometrician's time for your evaluation appointment. As such, there is a No-Show fee of $100. If you are unable to attend any of your appointments, please contact our office as soon as possible to reschedule.      DRIVING: Regarding driving, in patients with progressive memory problems, driving will  be impaired. We advise to have someone else do the driving if trouble finding directions or if minor accidents are reported. Independent driving assessment is available to determine safety of driving.   If you are interested in the driving assessment, you can contact the  following:  [facility] [employer] in [place] [phone]  [facility] [phone]  [facility] [phone]  [facility] [phone] or [phone]   FALL PRECAUTIONS: Be cautious when walking. Scan the area for obstacles that may increase the risk of trips and falls. When getting up in the mornings, sit up at the edge of the bed for a few minutes before getting out of bed. Consider elevating the bed at the head end to avoid drop of blood pressure when getting up. Walk always in a well-lit room (use night lights in the walls). Avoid area rugs or power cords from appliances in the middle of the walkways. Use a walker or a cane if necessary and consider physical therapy for balance exercise. Get your eyesight checked regularly.  FINANCIAL OVERSIGHT: Supervision, especially oversight when making financial decisions or transactions is also recommended.  HOME SAFETY: Consider the safety of the kitchen when operating appliances like stoves, microwave oven, and blender. Consider having supervision and share cooking responsibilities until no longer able to participate in those. Accidents with firearms and other hazards in the house should be identified and addressed as well.   ABILITY TO BE LEFT ALONE: If patient is unable to contact 911 operator, consider using LifeLine, or when the need is there, arrange for someone to stay with patients. Smoking is a fire hazard, consider supervision or cessation. Risk of wandering should be assessed by caregiver and if detected at any point, supervision and safe proof recommendations should be instituted.  MEDICATION SUPERVISION: Inability to self-administer medication needs to be constantly addressed. Implement a mechanism to ensure safe administration of the medications.      Mediterranean Diet A Mediterranean diet refers to food and lifestyle choices that are based on the traditions of countries located on the  [employer]. This way of eating has been shown to help prevent certain conditions and improve outcomes for people who have chronic diseases, like kidney disease and heart disease. What are tips for following this plan? Lifestyle  Cook and eat meals together with your family, when possible. Drink enough fluid to keep your urine clear or pale yellow. Be physically active every day. This includes: Aerobic exercise like running or swimming. Leisure activities like gardening, walking, or housework. Get 7-8 hours of sleep each night. If recommended by your health care provider, drink red wine in moderation. This means 1 glass a day for nonpregnant women and 2 glasses a day for men. A glass of wine equals 5 oz (150 mL). Reading food labels  Check the serving size of packaged foods. For foods such as rice and pasta, the serving size refers to the amount of cooked product, not dry. Check the total fat in packaged foods. Avoid foods that have saturated fat or trans fats. Check the ingredients list for added sugars, such as corn syrup. Shopping  At the grocery store, buy most of your food from the areas near the walls of the store. This includes: Fresh fruits and vegetables (produce). Grains, beans, nuts, and seeds. Some of these may be available in unpackaged forms or large amounts (in bulk). Fresh seafood. Poultry and eggs. Low-fat dairy products. Buy whole ingredients instead of prepackaged foods. Buy fresh fruits and  vegetables in-season from local farmers markets. Buy frozen fruits and vegetables in resealable bags. If you do not have access to quality fresh seafood, buy precooked frozen shrimp or canned fish, such as tuna, salmon, or sardines. Buy small amounts of raw or cooked vegetables, salads, or olives from the deli or salad bar at your store. Stock your pantry so you always have certain foods on hand, such as olive oil, canned tuna, canned tomatoes, rice, pasta, and beans. Cooking   Cook foods with extra-virgin olive oil instead of using butter or other vegetable oils. Have meat as a side dish, and have vegetables or grains as your main dish. This means having meat in small portions or adding small amounts of meat to foods like pasta or stew. Use beans or vegetables instead of meat in common dishes like chili or lasagna. Experiment with different cooking methods. Try roasting or broiling vegetables instead of steaming or sauteing them. Add frozen vegetables to soups, stews, pasta, or rice. Add nuts or seeds for added healthy fat at each meal. You can add these to yogurt, salads, or vegetable dishes. Marinate fish or vegetables using olive oil, lemon juice, garlic, and fresh herbs. Meal planning  Plan to eat 1 vegetarian meal one day each week. Try to work up to 2 vegetarian meals, if possible. Eat seafood 2 or more times a week. Have healthy snacks readily available, such as: Vegetable sticks with hummus. Greek yogurt. Fruit and nut trail mix. Eat balanced meals throughout the week. This includes: Fruit: 2-3 servings a day Vegetables: 4-5 servings a day Low-fat dairy: 2 servings a day Fish, poultry, or lean meat: 1 serving a day Beans and legumes: 2 or more servings a week Nuts and seeds: 1-2 servings a day Whole grains: 6-8 servings a day Extra-virgin olive oil: 3-4 servings a day Limit red meat and sweets to only a few servings a month What are my food choices? Mediterranean diet Recommended Grains: Whole-grain pasta. Brown rice. Bulgar wheat. Polenta. Couscous. Whole-wheat bread. Dwyane Glad. Vegetables: Artichokes. Beets. Broccoli. Cabbage. Carrots. Eggplant. Green beans. Chard. Kale. Spinach. Onions. Leeks. Peas. Squash. Tomatoes. Peppers. Radishes. Fruits: Apples. Apricots. Avocado. Berries. Bananas. Cherries. Dates. Figs. Grapes. Lemons. Melon. Oranges. Peaches. Plums. Pomegranate. Meats and other protein foods: Beans. Almonds. Sunflower seeds. Pine  nuts. Peanuts. Cod. Salmon. Scallops. Shrimp. Tuna. Tilapia. Clams. Oysters. Eggs. Dairy: Low-fat milk. Cheese. Greek yogurt. Beverages: Water. Red wine. Herbal tea. Fats and oils: Extra virgin olive oil. Avocado oil. Grape seed oil. Sweets and desserts: [place] yogurt with honey. Baked apples. Poached pears. Trail mix. Seasoning and other foods: Basil. Cilantro. Coriander. Cumin. Mint. Parsley. Sage. Rosemary. Tarragon. Garlic. Oregano. Thyme. Pepper. Balsalmic vinegar. Tahini. Hummus. Tomato sauce. Olives. Mushrooms. Limit these Grains: Prepackaged pasta or rice dishes. Prepackaged cereal with added sugar. Vegetables: Deep fried potatoes (french fries). Fruits: Fruit canned in syrup. Meats and other protein foods: Beef. Pork. Lamb. Poultry with skin. Hot dogs. Helene Loader. Dairy: Ice cream. Sour cream. Whole milk. Beverages: Juice. Sugar-sweetened soft drinks. Beer. Liquor and spirits. Fats and oils: Butter. Canola oil. Vegetable oil. Beef fat (tallow). Lard. Sweets and desserts: Cookies. Cakes. Pies. Candy. Seasoning and other foods: Mayonnaise. Premade sauces and marinades. The items listed may not be a complete list. Talk with your dietitian about what dietary choices are right for you. Summary The Mediterranean diet includes both food and lifestyle choices. Eat a variety of fresh fruits and vegetables, beans, nuts, seeds, and whole grains. Limit the amount of red meat  and sweets that you eat. Talk with your health care provider about whether it is safe for you to drink red wine in moderation. This means 1 glass a day for nonpregnant women and 2 glasses a day for men. A glass of wine equals 5 oz (150 mL). This information is not intended to replace advice given to you by your health care provider. Make sure you discuss any questions you have with your health care provider. Document Released: 03/04/2016 Document Revised: 04/06/2016 Document Reviewed: 03/04/2016 Elsevier Interactive Patient  Education  2017 [employer].

## 2023-12-30 ENCOUNTER — Telehealth: Payer: Self-pay | Admitting: Physician Assistant

## 2023-12-30 LAB — QUEST AD-DETECT™, BETA-AMYLOID 42/40 RATIO, PLASMA
ABETA 40: 428 pg/mL
ABETA 42/40 RATIO: 0.15 — ABNORMAL LOW (ref >=0.170)
ABETA 42: 64 pg/mL

## 2023-12-30 LAB — ADMARK® APOE GENOTYPE ANALYSIS AND INTERPRETATION SYMPTOMATIC

## 2023-12-30 LAB — QUEST AD-DETECT PHOSPHORYLATED TAU217(P-TAU217), PLASMA: Quest Detect PTAU217, Plasma: 0.5 pg/mL — ABNORMAL HIGH (ref <=0.15)

## 2023-12-30 LAB — QUEST AD-DETECT® PHOSPHORYLATED TAU181(P-TAU181), PLASMA: QUEST AD DETECT PTAU181, PLASMA: 2.29 pg/mL — ABNORMAL HIGH (ref <=1.07)

## 2023-12-30 NOTE — Telephone Encounter (Signed)
 Pt would like her lab results

## 2024-01-18 NOTE — Telephone Encounter (Signed)
 Left message with the after hour service on 01-18-24   Caller states that she would like to speak to [place] about lab results

## 2024-01-19 NOTE — Telephone Encounter (Signed)
 Will call me back , awaiting nephrologist opinion.

## 2024-01-20 ENCOUNTER — Telehealth: Payer: Self-pay

## 2024-01-20 ENCOUNTER — Telehealth: Payer: Self-pay | Admitting: Physician Assistant

## 2024-01-20 ENCOUNTER — Other Ambulatory Visit: Payer: Self-pay

## 2024-01-20 DIAGNOSIS — R413 Other amnesia: Secondary | ICD-10-CM

## 2024-01-20 NOTE — Telephone Encounter (Signed)
 Pt. Cld said Kidney doctor is ok to go forward with PET scan, please make order and call when completed

## 2024-01-20 NOTE — Telephone Encounter (Signed)
 I have placed order

## 2024-01-24 NOTE — Telephone Encounter (Signed)
 Close encounter

## 2024-01-30 ENCOUNTER — Other Ambulatory Visit: Payer: Self-pay | Admitting: Nurse Practitioner

## 2024-01-30 DIAGNOSIS — Z1231 Encounter for screening mammogram for malignant neoplasm of breast: Secondary | ICD-10-CM

## 2024-01-30 NOTE — Telephone Encounter (Signed)
 Pt called no answer her mail box is full

## 2024-01-30 NOTE — Telephone Encounter (Signed)
 Pt called stating she called on the 27th of June and asked for a call back but no one has called her back. This is regarding a PET scan

## 2024-01-30 NOTE — Telephone Encounter (Signed)
 Radioactive water based

## 2024-01-30 NOTE — Telephone Encounter (Signed)
 Pt called all her questions has been answered she is going to [place] for her pet scan she is able to use what they are injection, pt was happy with the call back,

## 2024-01-31 DIAGNOSIS — L821 Other seborrheic keratosis: Secondary | ICD-10-CM | POA: Diagnosis not present

## 2024-01-31 DIAGNOSIS — C44529 Squamous cell carcinoma of skin of other part of trunk: Secondary | ICD-10-CM | POA: Diagnosis not present

## 2024-02-10 ENCOUNTER — Encounter (HOSPITAL_COMMUNITY)

## 2024-02-15 ENCOUNTER — Encounter: Payer: Self-pay | Admitting: Physician Assistant

## 2024-02-15 NOTE — Progress Notes (Signed)
 To: [employer] [address] [place] [address]  Re: Appeal for Coverage of FDG-PET Scan Patient: Carolyn Wise DOB: July 25, 1951  Insurance ID: [id number] Case number [id number], case #2, [id number]  To whom it may concern:  I am writing to formally appeal the denial of coverage for an FDG-PET brain scan for my patient, Carolyn Wise, who is being evaluated for suspected Alzheimer's Disease. This diagnostic tool is medically necessary for accurate diagnosis and treatment planning in this case.  Carolyn Wise  is a 73 year old female presenting with a 74-month history of progressive memory difficulties in a patient with strong family history of Alzheimer's disease.  Standard diagnostic tools, including MRI and neuropsychological testing, have been performed but have not provided conclusive evidence to differentiate  neurodegenerative disorders such as Alzheimer's disease.  The FDG-PET scan is a well-validated imaging modality that can help distinguish Alzheimer disease from other causes of dementia, particularly in early or atypical presentations. It is recommended in such cases by multiple guidelines, including those from the [employer] (AAN) and the European Federation of Neurological Societies, especially when clinical findings are ambiguous or overlap with other disorders.  Justification for Medical Necessity:  MRI findings May 2025 are non-specific and do not rule out Alzheimer's disease. Biomarkers 12/16/2023 showed beta 42/40 ratio of 0.150, placing her at a higher risk for Alzheimer's disease Clinical diagnosis remains uncertain without functional imaging. Accurate diagnosis is critical to guide treatment, avoid inappropriate medications  FDG-PET has been shown to improve diagnostic accuracy in suspected Alzheimer's disease by detecting hypometabolism in the areas affected by it. Given the progressive and disabling nature of Alzheimer disease and the limitations of  other diagnostic tools, I strongly urge you to reconsider coverage for this FDG-PET scan. Denying this test compromises our ability to make an accurate diagnosis and provide the appropriate care for this patient.  Please do not hesitate to contact our office for further information. Thank you for your time and attention, your kind consideration is appreciated.    Sincerely,   Camie Sevin, PA-C Physician assistant [facility]

## 2024-02-16 ENCOUNTER — Telehealth: Payer: Self-pay

## 2024-02-16 NOTE — Telephone Encounter (Signed)
 After appealing with insurance the notice was given deniedfor PET scan. Please advise on your next steps with patient, once I call her. Thank you

## 2024-02-17 NOTE — Telephone Encounter (Signed)
 I contacted patient she is fine with the decision on the insurance, she will follow up in 6 months as directed. Appreciative of the call to her.

## 2024-02-21 ENCOUNTER — Telehealth: Payer: Self-pay | Admitting: Physician Assistant

## 2024-02-21 DIAGNOSIS — Z961 Presence of intraocular lens: Secondary | ICD-10-CM | POA: Diagnosis not present

## 2024-02-21 DIAGNOSIS — H2511 Age-related nuclear cataract, right eye: Secondary | ICD-10-CM | POA: Diagnosis not present

## 2024-02-21 DIAGNOSIS — H25811 Combined forms of age-related cataract, right eye: Secondary | ICD-10-CM | POA: Diagnosis not present

## 2024-02-21 NOTE — Telephone Encounter (Signed)
 I spoke to patient and packet dropped in mail, she thanked me for calling.

## 2024-02-21 NOTE — Telephone Encounter (Signed)
 Pt called in wanting to get the paperwork submitted to the insurance for the PET scan. Her insurance agent is going to appeal it for us . The agent stated she has never had one turned down and wants to fight it. They just need what we sent. She would like it to be mailed to her.

## 2024-02-21 NOTE — Telephone Encounter (Signed)
 Lots of pages, I will copy and have patient pick up.

## 2024-03-06 DIAGNOSIS — H2512 Age-related nuclear cataract, left eye: Secondary | ICD-10-CM | POA: Diagnosis not present

## 2024-03-06 DIAGNOSIS — Z961 Presence of intraocular lens: Secondary | ICD-10-CM | POA: Diagnosis not present

## 2024-03-06 DIAGNOSIS — H25812 Combined forms of age-related cataract, left eye: Secondary | ICD-10-CM | POA: Diagnosis not present

## 2024-03-15 ENCOUNTER — Telehealth: Payer: Self-pay | Admitting: Physician Assistant

## 2024-03-15 NOTE — Telephone Encounter (Signed)
 Peer to peer is no longer available. We had sent a letter, still denied. Patient asked me to call and it is still denied. Going to call again to appeal and fax again. [phone] fax appeals number phone [phone].

## 2024-03-15 NOTE — Telephone Encounter (Signed)
 Pt called this morning and she stated that she has some information for you concerning the appeal for the insurance. Thanks

## 2024-03-15 NOTE — Telephone Encounter (Signed)
 I resent clinicals and letter for an appeal

## 2024-03-15 NOTE — Telephone Encounter (Signed)
 This was for a PetScan will call later in clinic, finds clinical notes

## 2024-03-15 NOTE — Telephone Encounter (Signed)
 [phone] Insurance

## 2024-03-19 ENCOUNTER — Telehealth: Payer: Self-pay

## 2024-03-19 ENCOUNTER — Other Ambulatory Visit: Payer: Self-pay

## 2024-03-19 ENCOUNTER — Telehealth: Payer: Self-pay | Admitting: Physician Assistant

## 2024-03-19 DIAGNOSIS — R413 Other amnesia: Secondary | ICD-10-CM

## 2024-03-19 NOTE — Telephone Encounter (Signed)
 Caller carol from [employer] called and LM with AN. They are calling regarding an appeal that was submitted and approved. Caller states its valid from 03/16/24-09/16/24 [id number]- auth number

## 2024-03-19 NOTE — Telephone Encounter (Signed)
 Pet scan approved appeal case [id number] from August 22-Feb 2026

## 2024-03-19 NOTE — Telephone Encounter (Signed)
 Will call patient and [place] to get Pet Scan ordered

## 2024-03-19 NOTE — Telephone Encounter (Signed)
 I left message that it was approved, [place] will call her to schedule

## 2024-03-22 ENCOUNTER — Ambulatory Visit
Admission: RE | Admit: 2024-03-22 | Discharge: 2024-03-22 | Disposition: A | Discharge status: Home / Self Care | Source: Ambulatory Visit | Attending: Nurse Practitioner

## 2024-03-22 DIAGNOSIS — Z1231 Encounter for screening mammogram for malignant neoplasm of breast: Secondary | ICD-10-CM | POA: Diagnosis not present

## 2024-03-23 ENCOUNTER — Encounter (HOSPITAL_COMMUNITY)
Admission: RE | Admit: 2024-03-23 | Discharge: 2024-03-23 | Disposition: A | Discharge status: Home / Self Care | Source: Ambulatory Visit | Attending: Physician Assistant | Admitting: Physician Assistant

## 2024-03-23 DIAGNOSIS — R413 Other amnesia: Secondary | ICD-10-CM | POA: Diagnosis present

## 2024-03-23 DIAGNOSIS — R9089 Other abnormal findings on diagnostic imaging of central nervous system: Secondary | ICD-10-CM | POA: Diagnosis not present

## 2024-03-23 MED ORDER — FLORBETAPIR F 18 500-1900 MBQ/ML IV SOLN
10.6950 | Freq: Once | INTRAVENOUS | Status: AC
  Administered 2024-03-23: 10.695 via INTRAVENOUS

## 2024-03-30 NOTE — Telephone Encounter (Signed)
 Pet Scan results advised to patient and voiced understanding and will be scheduled per Dr. Georjean. Thanked me for calling.

## 2024-04-09 NOTE — Progress Notes (Signed)
 Assessment/Plan:   Mild Cognitive Impairment due to Alzheimer's disease  Carolyn Wise is a very pleasant 73 y.o. 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 Patient is accompanied in the office by her husband .Patient was first seen on  11/17/2023 with a MoCA of   29/30  Pertinent labs: A beta 42-40 ratio 0.150 (higher risk for AD), P tau 217 and plasma 0.50,, P tau 181 2.29 (elevated), APO E4 allele positive.  Findings are placing the patient at risk for Alzheimer's disease.  In view of all these findings, discussed with the patient referral to [facility] for therapeutic options, including medicine such as Lecanemab versus Donanemab versus other newer agents.  She agrees to proceed   Recommendations    Follow up in 6  months. Continue to control mood as per PCP Recommend good control of cardiovascular risk factors  Initial visit 11/17/23  How long did patient have memory difficulties? For the last 9 months.She is concerned due to strong family history of ADSome days I don't remember if I ate dinner or if I had breakfast. Reports some difficulty remembering new information, conversations and names.  Long-term memory is good. She is trying TMS and red light . repeats oneself?  Not really Disoriented when walking into a room?  Patient denies except occasionally not remembering what patient came to the room for    Leaving objects in unusual places? Denies.   Wandering behavior?  denies . Any personality changes?  Denies.   Any history of depression?:  Denies. Does have some stress in her life regarding  some construction at home and  her niece, who has brain damage. She had been her caregiver but she is now on a Nursing Home.    Hallucinations or paranoia?  Denies   Seizures?  Denies.    Any sleep changes? Does not sleep  well my brain sings songs all night long, tried melatonin without help. Denies vivid dreams, REM behavior or sleepwalking   Sleep apnea?  Denies   Any hygiene concerns?  Denies   Independent of bathing and dressing?  Endorsed  Does the patient needs help with medications? Patient is in charge   Who is in charge of the finances? Patient is in charge     Any changes in appetite?  Denies, tries to eat health healthy, drinks plenty water.      Patient have trouble swallowing? None after esophageal dilatation Does the patient cook? Not much , husband does the cooking Any kitchen accidents such as leaving the stove on? Denies.   Any history of headaches?   Denies.   Chronic pain ? Denies.   Ambulates with difficulty? Works out daily, walks up to 2 miles daily.  Recent falls or head injuries? Denies. 2 years ago, hitting on concrete on the frontal area, and another mechanical fall a month later missing the step without head injury, no LOC in either fall. Vision changes?  Having cataract surgery this summer  Unilateral weakness, numbness or tingling? Denies.   Any tremors?  Denies.   Any anosmia?  Denies.   Any incontinence of urine? Urge incontinence. Wears pads.   Any bowel dysfunction? Because of stress had severe diarrhea, not it is intermittent. Had negative studies. Sees GI Patient lives with her husband     History of heavy alcohol intake?  Denies.   History of heavy tobacco use? Denies.   Family history of dementia? 3 maternal aunts  and mother with AD, one aunt with LBD.  Does patient drive? Yes, denies getting lost, drives mostly during the day due to cataracts        BP (!) 150/77   Pulse 94   Resp 18   Ht 4' 11 (1.499 m)   Wt 136 lb (61.7 kg)   SpO2 98%   BMI 27.47 kg/m   CURRENT MEDICATIONS:  Outpatient Encounter Medications as of 04/13/2024  Medication Sig   Calcium Carbonate-Vitamin D (CALCIUM-VITAMIN D) 600-125 MG-UNIT TABS Take by mouth daily.     Cholecalciferol  (VITAMIN D3) 50 MCG (2000 UT) capsule Take 2,000 Units by mouth daily.   famotidine (PEPCID) 20 MG tablet Take by mouth.   lisinopril (PRINIVIL,ZESTRIL) 10 MG tablet Take 1 tablet by mouth daily.   MAGNESIUM PO Take by mouth.   Multiple Vitamin (MULTIVITAMIN) capsule Take 1 capsule by mouth daily.   Omega-3 Fatty Acids (FISH OIL OMEGA-3 PO) Take by mouth. 1 PO DAILY   pravastatin (PRAVACHOL) 20 MG tablet Take by mouth.   Probiotic Product (PROBIOTIC PO) Take by mouth.   No facility-administered encounter medications on file as of 04/13/2024.        No data to display            11/17/2023    9:00 AM  [place] Cognitive Assessment   Visuospatial/ Executive (0/5) 5  Naming (0/3) 3  Attention: Read list of digits (0/2) 2  Attention: Read list of letters (0/1) 1  Attention: Serial 7 subtraction starting at 100 (0/3) 3  Language: Repeat phrase (0/2) 2  Language : Fluency (0/1) 1  Abstraction (0/2) 1  Delayed Recall (0/5) 5  Orientation (0/6) 6  Total 29  Adjusted Score (based on education) 29   Thank you for allowing us  the opportunity to participate in the care of this nice patient. Please do not hesitate to contact us  for any questions or concerns.   Total time spent on today's visit was 30 minutes dedicated to this patient today, preparing to see patient, examining the patient, ordering tests and/or medications and counseling the patient, documenting clinical information in the EHR or other health record, independently interpreting results and communicating results to the patient/family, discussing treatment and goals, answering patient's questions and coordinating care.  Cc:  Frederik Charleston, MD  Camie Sevin 04/13/2024 8:16 AM

## 2024-04-13 ENCOUNTER — Encounter: Payer: Self-pay | Admitting: Physician Assistant

## 2024-04-13 ENCOUNTER — Ambulatory Visit (INDEPENDENT_AMBULATORY_CARE_PROVIDER_SITE_OTHER): Admitting: Physician Assistant

## 2024-04-13 VITALS — BP 150/77 | HR 94 | Resp 18 | Ht 59.0 in | Wt 136.0 lb

## 2024-04-13 DIAGNOSIS — I10 Essential (primary) hypertension: Secondary | ICD-10-CM | POA: Diagnosis not present

## 2024-04-13 DIAGNOSIS — E559 Vitamin D deficiency, unspecified: Secondary | ICD-10-CM | POA: Diagnosis not present

## 2024-04-13 DIAGNOSIS — E785 Hyperlipidemia, unspecified: Secondary | ICD-10-CM | POA: Diagnosis not present

## 2024-04-13 DIAGNOSIS — R300 Dysuria: Secondary | ICD-10-CM | POA: Diagnosis not present

## 2024-04-13 DIAGNOSIS — R413 Other amnesia: Secondary | ICD-10-CM

## 2024-04-13 DIAGNOSIS — N183 Chronic kidney disease, stage 3 unspecified: Secondary | ICD-10-CM | POA: Diagnosis not present

## 2024-04-13 MED ORDER — DONEPEZIL HCL 10 MG PO TABS: ORAL_TABLET | ORAL | 3 refills | Status: AC

## 2024-04-13 NOTE — Patient Instructions (Addendum)
 It was a pleasure to see you today at our office.   Recommendations:  Neuropsychological testing  Referral to [facility] for treatment option Start donepezil  :Take half tablet (5 mg) daily for 2 weeks, then increase to the full tablet at 10 mg daily   6 months follow up    [email or web address]   RECOMMENDATIONS FOR ALL PATIENTS WITH MEMORY PROBLEMS: 1. Continue to exercise (Recommend 30 minutes of walking everyday, or 3 hours every week) 2. Increase social interactions - continue going to [place] and enjoy social gatherings with friends and family 3. Eat healthy, avoid fried foods and eat more fruits and vegetables 4. Maintain adequate blood pressure, blood sugar, and blood cholesterol level. Reducing the risk of stroke and cardiovascular disease also helps promoting better memory. 5. Avoid stressful situations. Live a simple life and avoid aggravations. Organize your time and prepare for the next day in anticipation. 6. Sleep well, avoid any interruptions of sleep and avoid any distractions in the bedroom that may interfere with adequate sleep quality 7. Avoid sugar, avoid sweets as there is a strong link between excessive sugar intake, diabetes, and cognitive impairment We discussed the Mediterranean diet, which has been shown to help patients reduce the risk of progressive memory disorders and reduces cardiovascular risk. This includes eating fish, eat fruits and green leafy vegetables, nuts like almonds and hazelnuts, walnuts, and also use olive oil. Avoid fast foods and fried foods as much as possible. Avoid sweets and sugar as sugar use has been linked to worsening of memory function.  There is always a concern of gradual progression of memory problems. If this is the case, then we may need to adjust level of care according to patient needs. Support, both to the patient and caregiver, should then be put into place.      You have been  referred for a neuropsychological evaluation (i.e., evaluation of memory and thinking abilities). Please bring someone with you to this appointment if possible, as it is helpful for the doctor to hear from both you and another adult who knows you well. Please bring eyeglasses and hearing aids if you wear them.    The evaluation will take approximately 3 hours and has two parts:   The first part is a clinical interview with the neuropsychologist (Dr. Richie or Dr. Gayland). During the interview, the neuropsychologist will speak with you and the individual you brought to the appointment.    The second part of the evaluation is testing with the doctor's technician Neal or Luke). During the testing, the technician will ask you to remember different types of material, solve problems, and answer some questionnaires. Your family member will not be present for this portion of the evaluation.   Please note: We must reserve several hours of the neuropsychologist's time and the psychometrician's time for your evaluation appointment. As such, there is a No-Show fee of $100. If you are unable to attend any of your appointments, please contact our office as soon as possible to reschedule.      DRIVING: Regarding driving, in patients with progressive memory problems, driving will be impaired. We advise to have someone else do the driving if trouble finding directions or if minor accidents are reported. Independent driving assessment is available to determine safety of driving.   If you are interested in the driving assessment, you can contact the following:  [employer] in [place] [phone]  [facility] [phone]  [facility] [place] [phone]  [employer] [phone]  or [phone]   FALL PRECAUTIONS: Be cautious when walking. Scan the area for obstacles that may increase the risk of trips and falls. When getting up in the mornings, sit up at the edge of  the bed for a few minutes before getting out of bed. Consider elevating the bed at the head end to avoid drop of blood pressure when getting up. Walk always in a well-lit room (use night lights in the walls). Avoid area rugs or power cords from appliances in the middle of the walkways. Use a walker or a cane if necessary and consider physical therapy for balance exercise. Get your eyesight checked regularly.  FINANCIAL OVERSIGHT: Supervision, especially oversight when making financial decisions or transactions is also recommended.  HOME SAFETY: Consider the safety of the kitchen when operating appliances like stoves, microwave oven, and blender. Consider having supervision and share cooking responsibilities until no longer able to participate in those. Accidents with firearms and other hazards in the house should be identified and addressed as well.   ABILITY TO BE LEFT ALONE: If patient is unable to contact 911 operator, consider using LifeLine, or when the need is there, arrange for someone to stay with patients. Smoking is a fire hazard, consider supervision or cessation. Risk of wandering should be assessed by caregiver and if detected at any point, supervision and safe proof recommendations should be instituted.  MEDICATION SUPERVISION: Inability to self-administer medication needs to be constantly addressed. Implement a mechanism to ensure safe administration of the medications.      Mediterranean Diet A Mediterranean diet refers to food and lifestyle choices that are based on the traditions of countries located on the [employer]. This way of eating has been shown to help prevent certain conditions and improve outcomes for people who have chronic diseases, like kidney disease and heart disease. What are tips for following this plan? Lifestyle  Cook and eat meals together with your family, when possible. Drink enough fluid to keep your urine clear or pale yellow. Be physically active  every day. This includes: Aerobic exercise like running or swimming. Leisure activities like gardening, walking, or housework. Get 7-8 hours of sleep each night. If recommended by your health care provider, drink red wine in moderation. This means 1 glass a day for nonpregnant women and 2 glasses a day for men. A glass of wine equals 5 oz (150 mL). Reading food labels  Check the serving size of packaged foods. For foods such as rice and pasta, the serving size refers to the amount of cooked product, not dry. Check the total fat in packaged foods. Avoid foods that have saturated fat or trans fats. Check the ingredients list for added sugars, such as corn syrup. Shopping  At the grocery store, buy most of your food from the areas near the walls of the store. This includes: Fresh fruits and vegetables (produce). Grains, beans, nuts, and seeds. Some of these may be available in unpackaged forms or large amounts (in bulk). Fresh seafood. Poultry and eggs. Low-fat dairy products. Buy whole ingredients instead of prepackaged foods. Buy fresh fruits and vegetables in-season from local farmers markets. Buy frozen fruits and vegetables in resealable bags. If you do not have access to quality fresh seafood, buy precooked frozen shrimp or canned fish, such as tuna, salmon, or sardines. Buy small amounts of raw or cooked vegetables, salads, or olives from the deli or salad bar at your store. Stock your pantry so you always have certain foods on hand,  such as olive oil, canned tuna, canned tomatoes, rice, pasta, and beans. Cooking  Cook foods with extra-virgin olive oil instead of using butter or other vegetable oils. Have meat as a side dish, and have vegetables or grains as your main dish. This means having meat in small portions or adding small amounts of meat to foods like pasta or stew. Use beans or vegetables instead of meat in common dishes like chili or lasagna. Experiment with different cooking  methods. Try roasting or broiling vegetables instead of steaming or sauteing them. Add frozen vegetables to soups, stews, pasta, or rice. Add nuts or seeds for added healthy fat at each meal. You can add these to yogurt, salads, or vegetable dishes. Marinate fish or vegetables using olive oil, lemon juice, garlic, and fresh herbs. Meal planning  Plan to eat 1 vegetarian meal one day each week. Try to work up to 2 vegetarian meals, if possible. Eat seafood 2 or more times a week. Have healthy snacks readily available, such as: Vegetable sticks with hummus. Greek yogurt. Fruit and nut trail mix. Eat balanced meals throughout the week. This includes: Fruit: 2-3 servings a day Vegetables: 4-5 servings a day Low-fat dairy: 2 servings a day Fish, poultry, or lean meat: 1 serving a day Beans and legumes: 2 or more servings a week Nuts and seeds: 1-2 servings a day Whole grains: 6-8 servings a day Extra-virgin olive oil: 3-4 servings a day Limit red meat and sweets to only a few servings a month What are my food choices? Mediterranean diet Recommended Grains: Whole-grain pasta. Brown rice. Bulgar wheat. Polenta. Couscous. Whole-wheat bread. [place] Made[place]. Vegetables: Artichokes. Beets. Broccoli. Cabbage. Carrots. Eggplant. Green beans. Chard. Kale. Spinach. Onions. Leeks. Peas. Squash. Tomatoes. Peppers. Radishes. Fruits: Apples. Apricots. Avocado. Berries. Bananas. Cherries. Dates. Figs. Grapes. Lemons. Melon. Oranges. Peaches. Plums. Pomegranate. Meats and other protein foods: Beans. Almonds. Sunflower seeds. Pine nuts. Peanuts. Cod. Salmon. Scallops. Shrimp. Tuna. Tilapia. Clams. Oysters. Eggs. Dairy: Low-fat milk. Cheese. Greek yogurt. Beverages: Water. Red wine. Herbal tea. Fats and oils: Extra virgin olive oil. Avocado oil. Grape seed oil. Sweets and desserts: [place] yogurt with honey. Baked apples. Poached pears. Trail mix. Seasoning and other foods: Basil. Cilantro. Coriander.  Cumin. Mint. Parsley. Sage. Rosemary. Tarragon. Garlic. Oregano. Thyme. Pepper. Balsalmic vinegar. Tahini. Hummus. Tomato sauce. Olives. Mushrooms. Limit these Grains: Prepackaged pasta or rice dishes. Prepackaged cereal with added sugar. Vegetables: Deep fried potatoes (french fries). Fruits: Fruit canned in syrup. Meats and other protein foods: Beef. Pork. Lamb. Poultry with skin. Hot dogs. [place]. Dairy: Ice cream. Sour cream. Whole milk. Beverages: Juice. Sugar-sweetened soft drinks. Beer. Liquor and spirits. Fats and oils: Butter. Canola oil. Vegetable oil. Beef fat (tallow). Lard. Sweets and desserts: Cookies. Cakes. Pies. Candy. Seasoning and other foods: Mayonnaise. Premade sauces and marinades. The items listed may not be a complete list. Talk with your dietitian about what dietary choices are right for you. Summary The Mediterranean diet includes both food and lifestyle choices. Eat a variety of fresh fruits and vegetables, beans, nuts, seeds, and whole grains. Limit the amount of red meat and sweets that you eat. Talk with your health care provider about whether it is safe for you to drink red wine in moderation. This means 1 glass a day for nonpregnant women and 2 glasses a day for men. A glass of wine equals 5 oz (150 mL). This information is not intended to replace advice given to you by your health care provider. Make sure you  discuss any questions you have with your health care provider. Document Released: 03/04/2016 Document Revised: 04/06/2016 Document Reviewed: 03/04/2016 Elsevier Interactive Patient Education  2017 [employer].

## 2024-04-26 DIAGNOSIS — N390 Urinary tract infection, site not specified: Secondary | ICD-10-CM | POA: Diagnosis not present

## 2024-05-07 ENCOUNTER — Telehealth: Payer: Self-pay

## 2024-05-07 NOTE — Telephone Encounter (Signed)
 I left message several times at [facility], to see when the patient if [facility] has called her. To call office back.

## 2024-05-14 ENCOUNTER — Ambulatory Visit: Payer: Self-pay

## 2024-05-14 ENCOUNTER — Ambulatory Visit (INDEPENDENT_AMBULATORY_CARE_PROVIDER_SITE_OTHER): Admitting: Psychology

## 2024-05-14 DIAGNOSIS — R419 Unspecified symptoms and signs involving cognitive functions and awareness: Secondary | ICD-10-CM | POA: Diagnosis not present

## 2024-05-14 DIAGNOSIS — R4189 Other symptoms and signs involving cognitive functions and awareness: Secondary | ICD-10-CM

## 2024-05-14 NOTE — Progress Notes (Unsigned)
 NEUROPSYCHOLOGICAL EVALUATION [facility]  [place] Department of Neurology  Date of Evaluation: 05/14/2024  REASON FOR REFERRAL   Carolyn Wise is a 73 year old, right-handed, White female with 16 years of formal education. She was referred for neuropsychological evaluation by Camie Sevin, PA-C, to assess current neurocognitive functioning, document potential cognitive deficits, and assist with treatment planning in the setting of biomarker-confirmed Alzheimer's disease. This is her first neuropsychological evaluation.  SUMMARY OF RESULTS   Premorbid cognitive abilities are estimated to be in the average range based on word reading and sociodemographic factors. Consistent with this baseline estimate, current performance was intact across all domains assessed, including attention/working memory, processing speed, executive functioning, language, visuospatial abilities, learning/memory, and fine motor dexterity. Several of her scores were not only within the expected range when compared to a normative sample of her peers but frequently exceeded expectations. Most notably, her memory scores represented some of her strongest performances, with the majority ranging from the high average to exceptionally high range.  DIAGNOSTIC IMPRESSION   Results of the current evaluation indicated normal cognitive functioning, with no impairments observed in any domain. Most notably, some of her strongest performances were observed across measures of learning/memory. Her overall profile reflects well-preserved cognitive and functional abilities at this time. While subjective cognitive concerns may be related to normal aging, recent life stressors, and mild cerebrovascular changes, they may also possibly reflect her experience of early neurodegenerative changes.  Although biomarker evidence confirms underlying Alzheimer's pathology, the patient's cognitive performance remains remarkably intact,  with memory abilities exceeding normative expectations. While her proactive engagement in long-term planning is commendable, it will be equally important for her to maintain a balance on her current situation. [name] is excellent for her age, and an excessive focus on the disease could detract from her quality of life. In fact, for many individuals without such a detailed neurological work-up, her cognitive profile alone would not have raised immediate concern--even for an early neurodegenerative process. Her case highlights the importance of individualized care that values both medical findings and lived experience, reminding us  that a diagnosis does not define the entirety of one's abilities or potential.  ICD-10 Codes: R41.9 Cognitive concerns with normal neuropsychological exam  RECOMMENDATIONS   In consultation with your doctor, schedule cognitive reevaluation on an as-needed basis to assess for cognitive decline and update treatment recommendations. Reevaluation should occur during a period of medical and affective stability.  Patient has already been prescribed a medication (i.e., donepezil ) aimed at addressing memory concerns. She is encouraged to continue taking this medication as prescribed. It is important to highlight that this medication has been shown to slow functional decline in some individuals.  Monitor mood closely, especially since recent stress may exacerbate cognitive difficulties. If symptoms begin to interfere with daily functioning, you may wish to consider exploring treatment options, such as mindfulness, relaxation techniques, or counseling.  Given ongoing sleep difficulties, she may benefit from the implementation of sleep hygiene techniques, including:  Go to bed and get up at the same time each day to help your body establish a regular rhythm. Establish and maintain a bedtime routine. Certain activities such as stretching, meditating, listening  to soft music, or reading ~15 minutes before bedtime can be a great way to regularly get your brain and body ready for sleep. Avoid taking naps during the day. Avoid alcohol and caffeine for 5 or 6 hours before going to bed. Get regular exercise, but not in the  hours before bedtime. Use comfortable bedding and maintain a cool temperature in your bedroom. Block out light and distracting noise. Avoid watching television or using your phone/computer in bed. Avoid staying in bed if you have difficulty falling asleep. If you have not been able to get to sleep after about 20 minutes or more, get up and do something calming or boring until you feel sleepy, then return to bed and try again.  Prioritize physical health through diet, exercise, and sleep. Regular physical activity supports cardiovascular health, improves mood, and helps preserve mobility and independence. Aim for at least 150 minutes of moderate aerobic exercise per week (e.g., brisk walking, swimming, gardening). A brain-healthy diet such as the Mediterranean or MIND diet is rich in fruits, vegetables, whole grains, healthy fats, and lean proteins, and has been associated with reduced risk of cognitive decline. Additionally, getting adequate, quality sleep and managing chronic conditions with the help of healthcare providers are essential components of healthy aging.  Continue to stay socially and mentally engaged. Maintaining strong social connections and regularly stimulating your brain can help protect against cognitive decline. This includes staying connected with friends and family, volunteering, or participating in community groups. Mentally engaging activities--such as reading, doing puzzles, playing strategy games, or learning a new language or musical instrument--promote brain plasticity. If you are interested in activities to support cognitive engagement, this site offers a variety of apps and games organized by difficulty  level:  https://www.[facility]row[facility].org/get-to-know-barrow/centers-programs/neurorehabilitation-center/neuro-rehab-apps-and-games/  Consider implementing compensatory strategies to maximize independence and maintain daily functioning. Examples include:  Adhere to routine. Compensatory strategies work best when they are used consistently. Use a planner, calendar, or white board that has the schedule and important events for the day clearly listed to reference and cross off when tasks are complete.  Ask for written information, especially if it is new or unfamiliar (e.g., information provided at a doctor's appointment).  Create an organized environment. Keep items that can be easily misplaced in a sensible location and get into the habit of always returning the items to those places. Pay attention and reduce distractions. Make a point of focusing attention on information you want to remember. One-on-one interaction is more likely to facilitate attention and minimize distraction. Make eye contact and repeat the information out loud after you hear it. Reduce interruptions or distractions especially when attempting to learn new information.  Create associations. When learning something new, think about and understand the information. Explain it in your own words or try to associate it with something you already know. Take notes to help remember important details. Evaluate goals and plan accordingly. When confronted by many different tasks, begin by making a list that prioritizes each task and estimates the time it will take to complete. Break down complicated tasks into smaller, more manageable steps. Focus on one task at a time and complete each task before starting another. Avoid multitasking.  DISPOSITION   Patient will follow up with the referring provider, Ms. Wertman. No follow-up neuropsychological testing was scheduled at this time. Please feel free to refer the patient for repeated evaluation if she  shows a significant change in neurocognitive status. She and her husband will be provided verbal feedback in approximately one week regarding the findings and impression during this visit.  The remainder of the report includes the details of the patient's background and a table of results from the current evaluation, which support the summary and recommendations described above.  BACKGROUND   History of Presenting Illness: The following information  was obtained from a review of medical records and an interview with the patient and her husband, Sharolyn. Briefly, the patient was first evaluated by Camie Sevin, PA-C, at [facility] on 11/17/2023 for cognitive concerns (MoCA = 29/30) in the context of a strong family history of Alzheimer's disease. Given this history, additional neurological workup was completed. Pertinent lab findings included: A?42/40 = 0.150, pTau181 = 2.29, pTau217 = 0.50, APOE genotype 3/4. In light of these findings, a referral to [facility] for therapeutic options was discussed, and this neuropsychological evaluation was requested as part of the referral process.  Cognitive Functioning: During today's appointment, the patient reported experiencing cognitive changes over the past 18 to 24 months. Her husband, however, believes these changes have been noticeable for more than a couple of years. Patient does not perceive a steady decline but feels the symptoms have been somewhat inconsistent. She specifically mentioned forgetfulness, such as difficulty recalling recent meals, trouble remembering names (which had previously been a strength), and misplacing items. She denied issues with remembering conversations or significant events. She also noted some word-finding difficulties. Otherwise, she has not observed major changes in attention, processing speed, visuospatial abilities, or executive functioning (e.g., planning, organizing, problem-solving). Her husband emphasized that the first  symptoms he noticed were generally related to forgetfulness.  Physical Functioning: Patient reported difficulties with sleep initiation, describing her brain as "singing songs." When asked to elaborate, she explained that the sound is internal--not something she hears externally--and consists of random familiar music, though not necessarily music she listened to that day/week. She denied issues with sleep maintenance or dream enactment behaviors. Appetite is variable, influenced by gastrointestinal symptoms; she has recently experienced frequent diarrhea and some weight loss. She denied changes in her sense of taste or smell. Vision remains stable following cataract surgery. She noted significant hearing loss in her right ear and some hearing loss in her left ear. Although she has not been advised to use hearing aids, she suspects the previous hearing evaluation may have been inadequate and wishes to seek an updated assessment. She denied balance problems or falls but noted some recent dizziness in the setting of urinary tract infections, which she attributes to antibiotics; this has since resolved. She denies any tremors.  Emotional Functioning: Patient described her recent mood as somewhat anxious due to personal life stressors since summer. She reported that when she feels this way, she tries to relax and remove herself from situation. She denied suicidal ideation.  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 MRI of the brain (12/01/2023) documented mild chronic small vessel ischemic disease and a prominent perivascular space within the right midbrain, without age-advanced or lobar-predominant cerebral atrophy.  Other Relevant Medical History: Remarkable for hypertension, hyperlipidemia, chronic kidney disease stage III, osteopenia, and gastroesophageal reflux disease. Please refer to the medical  record for a more comprehensive problem list. No history of stroke, CNS infection, head injury, or seizure was reported.  Current Medications and Vitamins/Supplements: Per record, calcium-vitamin D, donepezil , famotidine, lisinopril, magnesium, multivitamin, omega-3 fatty acids, pravastatin, probiotic products, and vitamin D3.   Functional Status: Patient independently performs all basic and instrumental (e.g., driving, medication management) activities of daily living without difficulty. She denies any issues operating household appliances. Her husband manages the finances, but this is longstanding.   Family Neurological History: Remarkable for Alzheimer's disease (mother and three maternal aunts), dementia with Lewy bodies (maternal aunt), and late-life dementia with hallucinations (  father).  Psychiatric History: History of depression, anxiety, prior mental health treatment, suicidal ideation, hallucinations, and psychiatric hospitalizations was not reported.  Substance Use History: Patient denied current use of alcohol, nicotine, marijuana, and illicit substances.  Social and Developmental History: Patient was born in [place], [place]. History of perinatal complications and developmental delays was not reported. She is married and lives with her husband. They have two children, one of whom lives locally.   Educational and Occupational History: No history of childhood learning disability, special education services, or grade retention was reported. Patient suspected she may have experienced symptoms consistent with ADHD during childhood. She described herself as an average student and stated that, although she had the ability to achieve more, she prioritized her social life. She earned a bachelor's degree around the age of 32, majoring in elementary education and history. 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   BEHAVIORAL OBSERVATIONS   Patient arrived on time and was accompanied by her husband, Sharolyn. She ambulated independently and without gait disturbance. She was alert and fully oriented. She was appropriately groomed and dressed for the setting. No significant sensory or motor abnormalities were observed. Vision and hearing were adequate for testing purposes. Speech was of normal rate, prosody, and volume. No conversational word-finding difficulties, paraphasic errors, or dysarthria were observed. Comprehension was conversationally intact. Thought processes were linear, logical, and coherent. Thought content was organized and devoid of delusions. Insight appeared appropriate. Affect was even and congruent with euthymic mood. She was cooperative and gave adequate effort during testing, including on standalone and embedded measures of performance validity. Results are thought to accurately reflect her cognitive functioning at this time.  NEUROPSYCHOLOGICAL TESTING RESULTS   Tests Administered: Animal Naming Test; Brief Visuospatial Memory Test-Revised (BVMT-R) - Form 1; Controlled Oral Word Association Test (COWAT): FAS; Geriatric Anxiety Scale-10 Item (GAS-10); Geriatric Depression Scale Short Form (GDS-SF); Grooved Pegboard Test; [employer] Learning Test-Revised (HVLT-R) - From 1; Judgment of Line Orientation (JLO) - Form V; Neuropsychological Assessment Battery (NAB) - Subtest(s): Naming Form 1; Standalone performance validity test (PVT); Test of Premorbid Functioning (TOPF); Trail Making Test (TMT); Wechsler Adult Intelligence Scale Fifth Edition (WAIS-5) - Subtest(s): Similarities, Clinical cytogeneticist, Matrix Reasoning, Digit Sequencing, Coding, Running Digits, Symbol Search, Symbol Span; Wechsler Memory Scale Fourth Edition (WMS-IV) - Subtest(s): Logical Memory (LM); and [place]  Card Sorting Test 64  Card Version (WCST-64).  Test results are provided in the table below. Whenever possible, the patient's scores were compared against age-, sex-, and education-corrected normative samples. Interpretive descriptions are based on the AACN consensus conference statement on uniform labeling (Guilmette et al., 2020).  PREMORBID FUNCTIONING RAW  RANGE  TOPF 37 StdS=97 Average  ATTENTION & WORKING MEMORY RAW  RANGE  WAIS-5 Digit Sequencing -- ss=8 Average  WAIS-5 Running Digits -- ss=13 High Average  WAIS-5 Symbol Span -- ss=13 High Average  PROCESSING SPEED RAW  RANGE  Trails A 32''0e T=52 Average  WAIS-5 Coding  -- ss=11 Average  WAIS-5 Symbol Search -- ss=13 High Average  EXECUTIVE FUNCTION RAW  RANGE  Trails B 46''0e T=67 Above Average  WAIS-5 Matrix Reasoning -- ss=11 Average  WAIS-5 Similarities -- ss=10 Average  COWAT Letter Fluency 14+14+15 T=50 Average  WCST-64 Total Errors 14 T=60 High Average  WCST-64 Perseverative Errors 7 T=66 Above Average  WCST-64 Nonperseverative  Errors 7 T=53 Average  WCST-64 Categories Completed 4 >16%ile WNL  WCSR-64 FMS 0 -- --  LANGUAGE RAW  RANGE  COWAT Letter Fluency 14+14+15 T=50 Average  Animal Naming Test 21 T=54 Average  NAB Naming Test 31/31 T=58 WNL  VISUOSPATIAL RAW  RANGE  WAIS-5 Block Design -- ss=9 Average  JLO C/S=31/30 86+%ile WNL  BVMT-R Copy Trial 12/12 -- WNL  VERBAL LEARNING & MEMORY RAW  RANGE  HVLT-R Learning Trials (10+10+11)/36 T=64 Above Average  HVLT-R Delayed Recall 11/12 T=59 High Average  HVLT-R Recognition Hits 12 -- --  HVLT-R Recognition False Positives 1 -- --  HVLT-R Discrimination Index 11 T=53 Average  WMS-IV LM-I  (10+12+17)/53 ss=13 High Average  WMS-IV LM-II  (11+17)/39 ss=14 High Average  WMS-IV LM Recognition  (8+14)/23 >75%ile High Average to Exceptionally High  VISUAL LEARNING & MEMORY RAW  RANGE  BVMT-R Total Recall (10+12+12)/36 T=73 Exceptionally High  BVMT-R Delayed Recall 12/12 T=67 Above Average   BVMT-R Percent Retained 100 >16%ile WNL  BVMT-R Recognition Hits 6 >16%ile WNL  BVMT-R Recognition False Alarms 0 >16%ile WNL  BVMT-R Recognition Discrimination Index 6 >16%ile WNL  FINE MOTOR DEXTERITY RAW  RANGE  Grooved Pegboard (Dominant Hand) 72''1d T=51 Average  Grooved Pegboard (Non-Dominant Hand) 80''0d T=52 Average  QUESTIONNAIRES RAW  RANGE  GDS-SF 1 -- Minimal  GAS-10 4 -- Minimal  *Note: ss = scaled score; StdS = standard score; T = t-score; C/S = corrected raw score; WNL = within normal limits; BNL= below normal limits; D/C = discontinued. Scores from skewed distributions are typically interpreted as WNL (>=16th %ile) or BNL (<16th %ile).   INFORMED CONSENT   Patient was provided with a verbal description of the nature and purpose of the neuropsychological evaluation. Also reviewed were the foreseeable risks and/or discomforts and benefits of the procedure, limits of confidentiality, and mandatory reporting requirements of this provider. Patient was given the opportunity to have their questions answered. Oral consent to participate was provided by the patient.   This report was prepared as part of a clinical evaluation and is not intended for forensic use.  SERVICE   This evaluation was conducted by Renda Beckwith, Psy.D. In addition to time spent directly with the patient, total professional time (120 minutes) includes record review, integration of relevant medical history, test selection, interpretation of findings, and report preparation. A technician, Evalene Pizza, B.S., provided testing and scoring assistance (166 minutes).  Psychiatric Diagnostic Evaluation Services (Professional): 09208 x 1 Neuropsychological Testing Evaluation Services (Professional): 03867 x 1 Neuropsychological Testing Evaluation Services (Professional): 03866 x 1 Neuropsychological Test Administration and Scoring Radiographer, therapeutic): [phone] x 1 Neuropsychological Test Administration and Scoring  (Technician): [phone] x [phone]  This report was generated using voice recognition software. While this document has been carefully reviewed, transcription errors may be present. I apologize in advance for any inconvenience. Please contact me if further clarification is needed.            Renda Beckwith, Psy.D.             Neuropsychologist

## 2024-05-14 NOTE — Progress Notes (Signed)
   Psychometrician Note   Cognitive testing was administered to Carolyn Wise by Evalene Pizza, B.S. (psychometrist) under the supervision of Dr. Renda Beckwith, Psy.D., licensed psychologist on 05/14/2024. Ms. Tadros did not appear overtly distressed by the testing session per behavioral observation or responses across self-report questionnaires. Rest breaks were offered.   The battery of tests administered was selected by Dr. Renda Beckwith, Psy.D. with consideration to Ms. Finklea's current level of functioning, the nature of her symptoms, emotional and behavioral responses during interview, level of literacy, observed level of motivation/effort, and the nature of the referral question. This battery was communicated to the psychometrist. Communication between Dr. Renda Beckwith, Psy.D. and the psychometrist was ongoing throughout the evaluation and Dr. Renda Beckwith, Psy.D. was immediately accessible at all times. Dr. Renda Beckwith, Psy.D. provided supervision to the psychometrist on the date of this service to the extent necessary to assure the quality of all services provided.    Carolyn Wise will return within approximately 1-2 weeks for an interactive feedback session with Dr. Beckwith at which time her test performances, clinical impressions, and treatment recommendations will be reviewed in detail. Ms. Ellwood understands she can contact our office should she require our assistance before this time.  A total of 166 minutes of billable time were spent face-to-face with Ms. Cheetham by the psychometrist. This includes both test administration and scoring time. Billing for these services is reflected in the clinical report generated by Dr. Renda Beckwith, Psy.D.  This note reflects time spent with the psychometrician and does not include test scores or any clinical interpretations made by Dr. Beckwith. The full report will follow in a separate note.

## 2024-05-21 ENCOUNTER — Ambulatory Visit: Admitting: Psychology

## 2024-05-21 DIAGNOSIS — R419 Unspecified symptoms and signs involving cognitive functions and awareness: Secondary | ICD-10-CM

## 2024-05-21 DIAGNOSIS — N390 Urinary tract infection, site not specified: Secondary | ICD-10-CM | POA: Diagnosis not present

## 2024-05-21 NOTE — Progress Notes (Signed)
   NEUROPSYCHOLOGY FEEDBACK SESSION [facility]  [place] Department of Neurology  Date of Feedback Session: 05/21/2024  REASON FOR REFERRAL   Carolyn Wise is a 74 year old, right-handed, White female with 16 years of formal education. She was referred for neuropsychological evaluation by Camie Sevin, PA-C, to assess current neurocognitive functioning, document potential cognitive deficits, and assist with treatment planning in the setting of biomarker-confirmed Alzheimer's disease. This is her first neuropsychological evaluation.  FEEDBACK   Patient completed a comprehensive neuropsychological evaluation on 05/14/2024. Please refer to that encounter for the full report and recommendations. Briefly, results indicated normal cognitive functioning, with no impairments observed in any domain. Most notably, some of her strongest performances were observed across measures of learning/memory. Her overall profile reflects well-preserved cognitive and functional abilities at this time. While subjective cognitive concerns may be related to normal aging, recent life stressors, and mild cerebrovascular changes, they may also possibly reflect her experience of early neurodegenerative changes.   Today, the patient was unaccompanied. She was provided verbal feedback regarding the findings and impression during this visit, and her questions were answered. A copy of the report was provided at the conclusion of the visit.  DISPOSITION   No follow-up neuropsychological testing was scheduled at this time. Please feel free to refer the patient for repeated evaluation if she shows a significant change in neurocognitive status.  SERVICE   This feedback session was conducted by Renda Beckwith, Psy.D. One unit of 03867 (35 minutes) was billed for Dr. Beckwith' time spent in preparing, conducting, and documenting the current feedback session.  This report was generated using voice recognition  software. While this document has been carefully reviewed, transcription errors may be present. I apologize in advance for any inconvenience. Please contact me if further clarification is needed.

## 2024-05-22 ENCOUNTER — Telehealth: Payer: Self-pay

## 2024-05-22 NOTE — Telephone Encounter (Signed)
 Neuropscy notes sent to [facility] neuro

## 2024-06-11 DIAGNOSIS — N183 Chronic kidney disease, stage 3 unspecified: Secondary | ICD-10-CM | POA: Diagnosis not present

## 2024-06-11 DIAGNOSIS — I129 Hypertensive chronic kidney disease with stage 1 through stage 4 chronic kidney disease, or unspecified chronic kidney disease: Secondary | ICD-10-CM | POA: Diagnosis not present

## 2024-06-15 DIAGNOSIS — D631 Anemia in chronic kidney disease: Secondary | ICD-10-CM | POA: Diagnosis not present

## 2024-06-15 DIAGNOSIS — E559 Vitamin D deficiency, unspecified: Secondary | ICD-10-CM | POA: Diagnosis not present

## 2024-06-15 DIAGNOSIS — N1831 Chronic kidney disease, stage 3a: Secondary | ICD-10-CM | POA: Diagnosis not present

## 2024-06-15 DIAGNOSIS — N3090 Cystitis, unspecified without hematuria: Secondary | ICD-10-CM | POA: Diagnosis not present

## 2024-06-15 DIAGNOSIS — I129 Hypertensive chronic kidney disease with stage 1 through stage 4 chronic kidney disease, or unspecified chronic kidney disease: Secondary | ICD-10-CM | POA: Diagnosis not present

## 2024-06-15 DIAGNOSIS — E889 Metabolic disorder, unspecified: Secondary | ICD-10-CM | POA: Diagnosis not present

## 2024-06-27 DIAGNOSIS — M19072 Primary osteoarthritis, left ankle and foot: Secondary | ICD-10-CM | POA: Diagnosis not present

## 2024-06-27 DIAGNOSIS — M79672 Pain in left foot: Secondary | ICD-10-CM | POA: Diagnosis not present

## 2024-08-09 ENCOUNTER — Other Ambulatory Visit: Payer: Self-pay | Admitting: Family Medicine

## 2024-08-09 DIAGNOSIS — Z1231 Encounter for screening mammogram for malignant neoplasm of breast: Secondary | ICD-10-CM

## 2024-10-11 ENCOUNTER — Ambulatory Visit: Admitting: Physician Assistant

## 2025-03-25 ENCOUNTER — Ambulatory Visit

## 2025-03-26 ENCOUNTER — Encounter: Admitting: Nurse Practitioner
